# Patient Record
Sex: Female | Born: 1960
Health system: Southern US, Community
[De-identification: ages and names within clinical notes are randomized; demographics above are authoritative.]

## PROBLEM LIST (undated history)

## (undated) ENCOUNTER — Emergency Department (HOSPITAL_COMMUNITY): Admission: EM | Payer: Self-pay | Source: Home / Self Care

## (undated) DIAGNOSIS — R739 Hyperglycemia, unspecified: Secondary | ICD-10-CM

## (undated) DIAGNOSIS — M549 Dorsalgia, unspecified: Secondary | ICD-10-CM

## (undated) DIAGNOSIS — R7303 Prediabetes: Secondary | ICD-10-CM

## (undated) DIAGNOSIS — E119 Type 2 diabetes mellitus without complications: Secondary | ICD-10-CM

## (undated) DIAGNOSIS — G4730 Sleep apnea, unspecified: Secondary | ICD-10-CM

## (undated) DIAGNOSIS — R12 Heartburn: Secondary | ICD-10-CM

## (undated) DIAGNOSIS — J45909 Unspecified asthma, uncomplicated: Secondary | ICD-10-CM

## (undated) DIAGNOSIS — R1310 Dysphagia, unspecified: Secondary | ICD-10-CM

## (undated) DIAGNOSIS — R0602 Shortness of breath: Secondary | ICD-10-CM

## (undated) DIAGNOSIS — E559 Vitamin D deficiency, unspecified: Secondary | ICD-10-CM

## (undated) DIAGNOSIS — R32 Unspecified urinary incontinence: Secondary | ICD-10-CM

## (undated) DIAGNOSIS — F32A Depression, unspecified: Secondary | ICD-10-CM

## (undated) DIAGNOSIS — E7800 Pure hypercholesterolemia, unspecified: Secondary | ICD-10-CM

## (undated) HISTORY — DX: Shortness of breath: R06.02

## (undated) HISTORY — DX: Vitamin D deficiency, unspecified: E55.9

## (undated) HISTORY — DX: Unspecified asthma, uncomplicated: J45.909

## (undated) HISTORY — DX: Prediabetes: R73.03

## (undated) HISTORY — DX: Pure hypercholesterolemia, unspecified: E78.00

## (undated) HISTORY — DX: Depression, unspecified: F32.A

## (undated) HISTORY — DX: Unspecified urinary incontinence: R32

## (undated) HISTORY — DX: Hyperglycemia, unspecified: R73.9

## (undated) HISTORY — DX: Type 2 diabetes mellitus without complications: E11.9

## (undated) HISTORY — DX: Dorsalgia, unspecified: M54.9

## (undated) HISTORY — DX: Sleep apnea, unspecified: G47.30

## (undated) HISTORY — DX: Dysphagia, unspecified: R13.10

## (undated) HISTORY — PX: FOOT SURGERY: SHX648

## (undated) HISTORY — DX: Heartburn: R12

## (undated) HISTORY — PX: CHOLECYSTECTOMY: SHX55

## (undated) HISTORY — PX: KNEE SURGERY: SHX244

---

## 2006-02-27 ENCOUNTER — Other Ambulatory Visit: Admission: RE | Admit: 2006-02-27 | Discharge: 2006-02-27 | Payer: Self-pay | Admitting: Family Medicine

## 2006-07-04 ENCOUNTER — Encounter: Admission: RE | Admit: 2006-07-04 | Discharge: 2006-07-04 | Payer: Self-pay | Admitting: Family Medicine

## 2008-12-26 ENCOUNTER — Other Ambulatory Visit: Admission: RE | Admit: 2008-12-26 | Discharge: 2008-12-26 | Payer: Self-pay | Admitting: Family Medicine

## 2009-03-11 LAB — CONVERTED CEMR LAB: Pap Smear: NORMAL

## 2009-11-10 ENCOUNTER — Encounter: Payer: Self-pay | Admitting: Family Medicine

## 2010-01-23 ENCOUNTER — Encounter: Payer: Self-pay | Admitting: Family Medicine

## 2010-01-23 ENCOUNTER — Ambulatory Visit: Payer: Self-pay | Admitting: Family Medicine

## 2010-01-23 DIAGNOSIS — F339 Major depressive disorder, recurrent, unspecified: Secondary | ICD-10-CM

## 2010-01-23 DIAGNOSIS — E669 Obesity, unspecified: Secondary | ICD-10-CM | POA: Insufficient documentation

## 2010-01-24 ENCOUNTER — Telehealth: Payer: Self-pay | Admitting: Family Medicine

## 2010-01-24 ENCOUNTER — Telehealth: Payer: Self-pay | Admitting: Psychology

## 2010-02-01 ENCOUNTER — Ambulatory Visit: Payer: Self-pay | Admitting: Psychology

## 2010-02-14 ENCOUNTER — Telehealth: Payer: Self-pay | Admitting: Psychology

## 2010-02-19 ENCOUNTER — Ambulatory Visit: Payer: Self-pay | Admitting: Family Medicine

## 2010-02-19 DIAGNOSIS — B070 Plantar wart: Secondary | ICD-10-CM

## 2010-02-19 DIAGNOSIS — M25519 Pain in unspecified shoulder: Secondary | ICD-10-CM

## 2010-02-22 ENCOUNTER — Ambulatory Visit: Payer: Self-pay | Admitting: Psychology

## 2010-03-02 ENCOUNTER — Telehealth: Payer: Self-pay | Admitting: *Deleted

## 2010-03-05 ENCOUNTER — Encounter: Payer: Self-pay | Admitting: Family Medicine

## 2010-03-05 ENCOUNTER — Ambulatory Visit: Payer: Self-pay | Admitting: Family Medicine

## 2010-03-05 DIAGNOSIS — M549 Dorsalgia, unspecified: Secondary | ICD-10-CM | POA: Insufficient documentation

## 2010-03-05 DIAGNOSIS — R030 Elevated blood-pressure reading, without diagnosis of hypertension: Secondary | ICD-10-CM | POA: Insufficient documentation

## 2010-03-05 LAB — CONVERTED CEMR LAB
BUN: 13 mg/dL (ref 6–23)
CO2: 24 meq/L (ref 19–32)
Calcium: 9.6 mg/dL (ref 8.4–10.5)
Creatinine, Ser: 0.77 mg/dL (ref 0.40–1.20)
HDL: 45 mg/dL (ref >39)
Vit D, 25-Hydroxy: 38 ng/mL (ref 30–89)

## 2010-03-07 ENCOUNTER — Ambulatory Visit: Payer: Self-pay | Admitting: Family Medicine

## 2010-04-20 ENCOUNTER — Encounter: Payer: Self-pay | Admitting: Psychology

## 2010-04-20 ENCOUNTER — Encounter: Payer: Self-pay | Admitting: *Deleted

## 2010-04-20 ENCOUNTER — Ambulatory Visit: Payer: Self-pay | Admitting: Family Medicine

## 2010-04-25 ENCOUNTER — Encounter
Admission: RE | Admit: 2010-04-25 | Discharge: 2010-06-15 | Payer: Self-pay | Source: Home / Self Care | Admitting: Family Medicine

## 2010-04-27 ENCOUNTER — Telehealth: Payer: Self-pay | Admitting: *Deleted

## 2010-04-30 ENCOUNTER — Ambulatory Visit: Payer: Self-pay | Admitting: Psychology

## 2010-05-21 ENCOUNTER — Ambulatory Visit: Payer: Self-pay | Admitting: Family Medicine

## 2010-05-21 ENCOUNTER — Ambulatory Visit: Payer: Self-pay | Admitting: Psychology

## 2010-05-29 ENCOUNTER — Ambulatory Visit: Payer: Self-pay | Admitting: Family Medicine

## 2010-06-07 ENCOUNTER — Ambulatory Visit: Payer: Self-pay | Admitting: Family Medicine

## 2010-06-07 ENCOUNTER — Encounter: Payer: Self-pay | Admitting: Family Medicine

## 2010-06-13 ENCOUNTER — Encounter: Payer: Self-pay | Admitting: Family Medicine

## 2010-06-13 ENCOUNTER — Ambulatory Visit: Payer: Self-pay | Admitting: Family Medicine

## 2010-06-25 ENCOUNTER — Telehealth: Payer: Self-pay | Admitting: Psychology

## 2010-07-23 ENCOUNTER — Ambulatory Visit (HOSPITAL_COMMUNITY)
Admission: RE | Admit: 2010-07-23 | Discharge: 2010-07-23 | Payer: Self-pay | Source: Home / Self Care | Attending: Gastroenterology | Admitting: Gastroenterology

## 2010-08-23 ENCOUNTER — Ambulatory Visit: Admit: 2010-08-23 | Payer: Self-pay

## 2010-08-24 ENCOUNTER — Ambulatory Visit: Admit: 2010-08-24 | Payer: Self-pay

## 2010-08-31 ENCOUNTER — Ambulatory Visit: Admit: 2010-08-31 | Payer: Self-pay

## 2010-09-11 NOTE — Assessment & Plan Note (Signed)
Summary: Initial behavioral Medicine   Primary Care Provider:  Ellin Mayhew MD   History of Present Illness: Brooke Clarke presented for an initial psychological assessment.  A client information sheet detailing the Behavioral Medicine Service was provided and contained information on confidentiality and the limits thereof.  Presenting problem: History of depression.  Dr. Edmonia James thought she should be seen.  She acknowledges the lack of social support is hard and she "needs someone to talk to."  Her biggest issue is her weight.  She reports gaining about 100 pounds in 10 years and attributes it to "sitting on her butt" at work.  She lacks motivation to exercise but does sound like she goes to the gym on occasion.  She walks her two dogs and considers this a chore.  Medical history: No major medical issues noted with the exception of obesity.  She has been on Paxil for a long time and has tried to discontinue it because she thinks it is another reason she gained weight / can't lose it.  She is down to 10 mg and tried cutting down to 5 mg but could not manage the symptoms she had (crying uncontrollably for instance).  Psychiatric / psychological history: Individual therapy about 15 years ago.  Denied hospitalization for psych reasons.  Has been tried on Prozac and Wellbutrin.  She said she could not tolerate the latter - it made her very anxious.  Psychosocial history: Married twice - once when 50 years old.  Lasted a year.  No kids.  Does not have contact. Second marriage to an [place].  Two kids:  Brooke Clarke (20) and Brooke Clarke (18).  Divorced around 16-Jan-2001.  They were living in [place].  He had an affair.  He is now married to that woman.  They have a 85 year old son together.  Brooke Clarke moved to [place] (she is from here), bought a house and started working.  Ex-husband is a Comptroller for [employer] in [place].  He helps her financially.  Daughter Brooke Clarke became pregnant recently (baby due August 1st).  She  married her boyfriend (and FOB) of two years recently.  They are living with his parents in [place] and plan to continue their education while raising their child together.  Brooke Clarke is with her Dad in [place] right now.  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  Per Ileana's report, Brooke Clarke has IBS.  She was cutting foods out of her diet in order to manager her IBS and dropped to 77 pounds.  She was admitted to [facility] and eventually transferred to the [facility] for re-feeding.  Brooke Clarke denies that Brooke Clarke has an eating disorder reporting that the way Brooke Clarke was trying to manage her IBS made her weight drop so low that she was no longer able to hold on to anything she ate and therefore, continued to lose.  This was a several month period of time in late Jan 16, 2009.  As a result of being away from work so much, Brooke Clarke lost her job at [employer]  [name]'s parents were married.  Her dad died in 17-Jan-1999 due to complications from DM.  Her mom is alive at 77 and is in poor health.  Brooke Clarke helps to care for her.    History of abuse: Did not assess.  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  She was not particularly happy at [employer]  and is not too upset about losing her job.  She is drawing unemployment.  She plans to start looking for jobs after her granddaughter is born.    Substance use: No tobacco.  Occasional alcohol (once a month maybe - one glass of wine).  Denied other use of drugs.  Other: Typical day currently:  Gets up at 8:00 a.m.  Checks email and submits applications (per her unemployment terms).  Wakls dog, eats cereal, drinks coffee.  Tries to convince herself to exercise.  Naps (occasionally) from 2-4 pm.  Does eat lunch and dinner.  Goes to bed around 10:00 or 11:00.  No difficulty sleeping.  She finished a huge project of getting her house organized recently.  She does not have  another project currently.  Attends church services but is not well connected.  Desires social support.     Allergies: No Known Drug Allergies   Impression & Recommendations:  Problem # 1:  DEPRESSIVE DISORDER NOT ELSEWHERE CLASSIFIED (ICD-311)  [place] is neatly groomed and appropriately dressed.  She maintains adequate eye contact but not great.  She is moderately cooperative and attentive.  Speech is normal in tone, rate and rhythm.  Mood is depressed with an irritable and tearful affect.  Thought process is logical and goal directed.  Does not appear to be responding to any internal stimuli.  Judgment and insight are average.  She wants to come off the Paxil.  At 10 mg it is not likely helping her all that much (her maintenance dose was at least 20 mg).  Recommended a Mood Disorder Clinic appointment to discuss options.  Her PHQ-9 score was 19 indicating symptoms of depression that were at least moderate.  Of note - she indicated thoughts about being better off dead or hurting herself in some way "more than half the days."  During the clinical interview, she reported some passive suicidal ideation and reported that she could never do anything to hurt herself because she would "go straight to hell."  She was concerned about confidentiality around this issue (afraid of involuntary commitment ??).  We reviewed the specifics of confidentiality again and she seemed relieved.  Report of first mood disturbance was as a teenager.  Did not elicit family history of mood issues but need to do that.  I suspect that she has dysthymia or major depression and may need medication to help her manage these symptoms.  Exercise would help as a mood elevator but her mood symptoms are a major barrier to this right now.  Will meet again on:  July 14th at 3:00 to explore further.  Will also appreciate the help of Mood Disorder Clinic.  That appt is for:  July 27th at 9:00.  Will touch base with Dr. Edmonia James to  clarify some things about Jameria's history.  Also - if we don't have a recent TSH and blood count on her, it might be useful.    Her updated medication list for this problem includes:    Paxil 20 Mg Tabs (Paroxetine hcl)  [facility]  Complete Medication List: 1)  Paxil 20 Mg Tabs (Paroxetine hcl) 2)  Multivitamin  3)  Aloe Supplement  4)  Tyrosine Powder Supplement

## 2010-09-11 NOTE — Miscellaneous (Signed)
Summary: Orders Update  Clinical Lists Changes  Orders: Added new Test order of [facility]- Est Level  3 (99213) - Signed  

## 2010-09-11 NOTE — Assessment & Plan Note (Signed)
Summary: obesity/wart/depression   Vital Signs:  Patient profile:   50 year old female Weight:      221 pounds BMI:     39.55 Temp:     97.9 degrees F oral Pulse rate:   69 / minute BP sitting:   133 / 84  (left arm)  Vitals Entered By: Tessie Fass CMA (April 20, 2010 10:09 AM) CC: F/U Is Patient Diabetic? No   Primary Care Provider:  Ellin Mayhew MD  CC:  F/U.  History of Present Illness: Obesity: Pt is working out on eliptical 30 min 4-5 x week.  She is working on eating a nutritiuous diet.  Pt has had some weight loss since last appt-- 7 lbs.  Pt has not met with nutritionist here at clinic.  States that she is doing well with nutrition changes on her own.  callous on 2nd toe of left foot: callous/wart continues to be present- was frozen at previous appt.  Pt requesting that cryotherapy be tried again.  Had explained to pt at last visit that it may make multiple treatments and sometimes these areas never go away.  Depression: Pt states that she is feeling better, more motivated,  is working on the medication changes as outlined by providers in mood disorder clinic.  Is seeing Dr. Pascal Lux for therapy.  States that she feels that this is slowly helping her improve.    Current Medications (verified): 1)  Paxil 10 Mg Tabs (Paroxetine Hcl) .... Per Dr. Kathrynn Running in Mood Disorder Clinic, Take One Daily. 2)  Multivitamin 3)  Aloe Supplement 4)  Tyrosine Powder Supplement 5)  Ibuprofen 600 Mg Tabs (Ibuprofen) .... Take 1 Tablet Every 8 Hours As Needed For Pain 6)  Prozac 10 Mg Caps (Fluoxetine Hcl) .... Per Dr. Kathrynn Running in Mood Disorder Clinic.  Take One Pill Per Day.  Allergies (verified): No Known Drug Allergies  Review of Systems       as per hpi  Physical Exam  General:  VSS Well-developed,well-nourished,in no acute distress; alert,appropriate and cooperative throughout examination Lungs:  Normal respiratory effort, chest expands symmetrically. Lungs are clear to  auscultation, no crackles or wheezes. Heart:  Normal rate and regular rhythm. S1 and S2 normal without gallop, murmur, click, rub or other extra sounds. Pulses:  2+ Extremities:  no edema small plantar wart on 2nd digit of right foot on lateral aspect along nail. Psych:  Cognition and judgment appear intact. Alert and cooperative with normal attention span and concentration. No apparent delusions, illusions, hallucinations   Impression & Recommendations:  Problem # 1:  OBESITY, UNSPECIFIED (ICD-278.00) Encouraged pt to keep up the good work!  Pt will return in 1 month for RN weight check and with me in 2 months for follow up for weight and for further review of PCMH form.   Orders: [facility]- Est  Level 4 (16109)  Problem # 2:  PLANTAR WART (ICD-078.12) Applied cryotherapy to area x 3 using gun applicator.  Pt tolerated well.  Pt aware that the area may not resolve even with this repeat treatment. Pt to return as needed for further treatment. Orders: [facility]- Est  Level 4 (99214)  Problem # 3:  DEPRESSIVE DISORDER NOT ELSEWHERE CLASSIFIED (ICD-311) Pt to continue to meet with mood disorder clinic and with Dr. Pascal Lux for therapy.  Pt feels she is moving in the right direction in this area.  Encouraged pt to keep up the hard work.  Her updated medication list for this problem includes:  Paxil 10 Mg Tabs (Paroxetine hcl) .Marland Kitchen... Per dr. Kathrynn Running in mood disorder clinic, take one daily.    Prozac 10 Mg Caps (Fluoxetine hcl) .Marland Kitchen... Per dr. Kathrynn Running in mood disorder clinic.  take one pill per day.  Orders: [facility]- Est  Level 4 (21308)  Problem # 4:  Preventive Health Care (ICD-V70.0) Reviewed PCMH form with pt.  Pt is due for colonoscopy and mammogram.  Pt states that she wants to start first with Colonoscopy and then will consider mammogram.   Pt had previous order placed for PT for eval of right shoulder rotator cuff pain.  Pt states that this was never sheduled.  She would like to meet with them a few  times to learn the needed exercises and then continue the exercises on her own.  Will ensure that these referrals are in place.    Complete Medication List: 1)  Paxil 10 Mg Tabs (Paroxetine hcl) .... Per dr. Kathrynn Running in mood disorder clinic, take one daily. 2)  Multivitamin  3)  Aloe Supplement  4)  Tyrosine Powder Supplement  5)  Ibuprofen 600 Mg Tabs (Ibuprofen) .... Take 1 tablet every 8 hours as needed for pain 6)  Prozac 10 Mg Caps (Fluoxetine hcl) .... Per dr. Kathrynn Running in mood disorder clinic.  take one pill per day.  Patient Instructions: 1)  return in 1 month for weight check- make nurse visit for this 2)  Return to see me in 2 months for follow up on weight loss. 3)  Check to see when last tetanus was.   4)  Also, continue to follow up as scheduled with dr. Pascal Lux and dr. Kathrynn Running.  5)  We will call you with an appt for Physical therapy and for your appt for a colonoscopy 6)  You are also due for an appt for a mammogram.  Let me know when you are ready to set up this appt.    Prevention & Chronic Care Immunizations   Influenza vaccine: Not documented   Influenza vaccine deferral: Deferred  (04/20/2010)    Tetanus booster: Not documented    Pneumococcal vaccine: Not documented  Colorectal Screening   Hemoccult: Not documented   Hemoccult action/deferral: Not indicated  (03/05/2010)    Colonoscopy: Not documented   Colonoscopy action/deferral: GI Referral  (03/05/2010)  Other Screening   Pap smear: per pt normal- no hx of abnormal  (03/11/2009)   Pap smear action/deferral: Deferred-3 yr interval  (03/05/2010)   Pap smear due: 03/11/2012    Mammogram: per pt. -normal  (03/11/2009)   Mammogram action/deferral: Ordered  (03/05/2010)   Mammogram due: 03/10/2010   Smoking status: never  (01/23/2010)  Lipids   Total Cholesterol: 249  (03/05/2010)   LDL: 169  (03/05/2010)   LDL Direct: Not documented   HDL: 45  (03/05/2010)   Triglycerides: 175  (03/05/2010)

## 2010-09-11 NOTE — Assessment & Plan Note (Signed)
Summary: depression/obesity/right shoulder/wart   Vital Signs:  Patient profile:   50 year old female Weight:      218 pounds BMI:     39.01 Temp:     98.5 degrees F oral Pulse rate:   74 / minute BP sitting:   119 / 82  (right arm) Cuff size:   large  Vitals Entered By: Tessie Fass CMA (June 13, 2010 2:05 PM) CC: F/U Is Patient Diabetic? No   Primary Care Provider:  Ellin Mayhew MD  CC:  F/U.  History of Present Illness: depression: Pt has not liked side effects of antidepressants in the past.  (see detailed discussion in Dr. Carola Rhine previous visits)   She states that 2/2 her depression she lacks motivation to work out and this contributes to her weight gain yet she feels like some antidepressents in the past have also contributed to her weight gain.   Has never taken zoloft so has agreed to try this.  right shoulder pain: not improved with PT.  Has been doing exercises and seeing pt x 2-3 weeks.   4th toe of right foot wart: requests that i freeze the wart again.  This will be treatment #3.    obesity: congratulated pt on her almost 15 lb weight loss!  although she states that she hasn't worked out x 1 week and that her diet is worsened , I encourage her to regain her motivation and continue the downward weight trend.    Habits & Providers  Alcohol-Tobacco-Diet     Tobacco Status: never  Current Medications (verified): 1)  Multivitamin 2)  Aloe Supplement 3)  Tyrosine Powder Supplement 4)  Ibuprofen 600 Mg Tabs (Ibuprofen) .... Take 1 Tablet Every 8 Hours As Needed For Pain 5)  Zoloft 25 Mg Tabs (Sertraline Hcl) .... Take 1 Tablet Daily  Allergies (verified): No Known Drug Allergies  Review of Systems       no fever. no rashes. no chnages in bowel or bladder.   no SI or HI.    Physical Exam  General:  VSS Well-developed,well-nourished,in no acute distress; alert,appropriate and cooperative throughout examination Lungs:  Normal respiratory effort,  chest expands symmetrically. Lungs are clear to auscultation, no crackles or wheezes. Heart:  Normal rate and regular rhythm. S1 and S2 normal without gallop, murmur, click, rub or other extra sounds. Msk:  right shoulder exam: normal ROM with some pain in right anterior shoulder with right arm straight and posterior to torso.  No pain with empty cup exam.  Some minimal discomfort with movement of arm to touch central back area.   Extremities:  wart present at medial side of nail of 2nd toe of right foot.  Psych:  Cognition and judgment appear intact. Alert and cooperative with normal attention span and concentration. No apparent delusions, illusions, hallucinations   Impression & Recommendations:  Problem # 1:  DEPRESSIVE DISORDER NOT ELSEWHERE CLASSIFIED (ICD-311) Pt started on zoloft.  Pt has follow up appt with Dr. Pascal Lux and also a follow up appt with me in 3-4 weeks for titration of medication.  The following medications were removed from the medication list:    Prozac 10 Mg Caps (Fluoxetine hcl) .Marland Kitchen... Per dr. Kathrynn Running in mood disorder clinic.  take one pill per day. Her updated medication list for this problem includes:    Zoloft 25 Mg Tabs (Sertraline hcl) .Marland Kitchen... Take 1 tablet daily  Orders: [facility]- Est  Level 4 (29562)  Problem # 2:  PLANTAR WART (ZHY-865.78)  3rd treatment of cryotherapy applied ot wart.  Pt is aware that some warts are very difficult to remove and that this may not be curative.  Orders: [facility]- Est  Level 4 (99214)  Problem # 3:  SHOULDER PAIN, RIGHT, CHRONIC (ICD-719.41) Pt is to continue with PT exercises.  I feel that this is most likely  rotator cuff inflammation.  If not improved by next appt will consider referral to sports' medicine clinic.   The following medications were removed from the medication list:    Percocet 5-325 Mg Tabs (Oxycodone-acetaminophen) .Marland Kitchen... 1 to 2 tabs by mouth every 6 hours as needed for extreme pain Her updated medication list for this  problem includes:    Ibuprofen 600 Mg Tabs (Ibuprofen) .Marland Kitchen... Take 1 tablet every 8 hours as needed for pain  Orders: [facility]- Est  Level 4 (16109)  Problem # 4:  OBESITY, UNSPECIFIED (ICD-278.00) pt encouraged to continue to work on diet and exercise.  Orders: [facility]- Est  Level 4 (60454)  Problem # 5:  Preventive Health Care (ICD-V70.0) at future visit need to review PCMH form with pt.   Complete Medication List: 1)  Multivitamin  2)  Aloe Supplement  3)  Tyrosine Powder Supplement  4)  Ibuprofen 600 Mg Tabs (Ibuprofen) .... Take 1 tablet every 8 hours as needed for pain 5)  Zoloft 25 Mg Tabs (Sertraline hcl) .... Take 1 tablet daily  Patient Instructions: 1)  take zoloft as directed- return in 3-4 weeks for reevaluation 2)  Continue to work hard on weight loss- remember to plan the day before to exercise the next day. 3)  If continued right shoulder pain at next appt--we can discuss possible referral to sport's medicine clinic.  Prescriptions: ZOLOFT 25 MG TABS (SERTRALINE HCL) take 1 tablet daily  #30 x 3   Entered and Authorized by:   Ellin Mayhew MD   Signed by:   Ellin Mayhew MD on 06/13/2010   Method used:   Print then Give to Patient   RxID:   [id number]    Orders Added: 1)  [facility]- Est  Level 4 [30865]

## 2010-09-11 NOTE — Assessment & Plan Note (Signed)
Summary: Behavioral Medicine Follow-up   Primary Care Provider:  Ellin Mayhew MD   History of Present Illness: Brooke Clarke reports that she is doing well overall despite some stress the last week or so.  Once she figured out where it was coming from, her mood improved.  She was recently disappointed to discover that she had not lost 30 pounds in six weeks as she had thought.  Rather, she lost 10.  The scale at the [facility] was off.  She is having trouble looking favorable at her weight loss given the fact that it is much less than what she originally thought.  We discussed this.  She has been off the Paxil for one week.  Her last dose was 5 mg - breaking it to 2.5 mg proved too difficult.  She has not had any unpleasant effects.  She is wanting to come off the Prozac.  She remembers it causing shortness of breath while exercising the last time she took it and is noticing that again.  She is taking 10 mg.  She is looking for work but has decided that she will wait for a good match both in terms of interest and compensation.  She thinks she has 29 weeks of unemployment left.  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  She likes the structure of it very much.  She is requesting information about a therapist for her daughter.    Allergies: No Known Drug Allergies   Impression & Recommendations:  Problem # 1:  DEPRESSIVE DISORDER NOT ELSEWHERE CLASSIFIED (ICD-311)  Report of mood is good.  Affect is consistent.  Thoughts are clear and goal directed.  Weight:  Discussed exercise and healthy eating as a positive thing to do for her mental and physical health regardless of what the number on the scale shows.  Medication:  Will check with Dr. Kathrynn Running about next best step and will call Brooke Clarke with the information.  Referral for daughter:  Recommended Brooke Clarke who has experience with eating disorders.  Brooke Clarke was given as a back-up in case Brooke Clarke doesn't take Medicaid.  Will follow October 10th at 9:00 or as needed.  The following medications were removed from the medication list:    Paxil 10 Mg Tabs (Paroxetine hcl) .Marland Kitchen... Per dr. Kathrynn Running in mood disorder clinic, take one daily. Her updated medication list for this problem includes:    Prozac 10 Mg Caps (Fluoxetine hcl) .Marland Kitchen... Per dr. Kathrynn Running in mood disorder clinic.  take one pill per day.  Orders: Therapy 40-50- min- [facility] (44010)  Complete Medication List: 1)  Multivitamin  2)  Aloe Supplement  3)  Tyrosine Powder Supplement  4)  Ibuprofen 600 Mg Tabs (Ibuprofen) .... Take 1 tablet every 8 hours as needed for pain 5)  Prozac 10 Mg Caps (Fluoxetine hcl) .... Per dr. Kathrynn Running in mood disorder clinic.  take one pill per day.  Appended Document: Behavioral Medicine Follow-up Discussed situation with Dr. Kathrynn Running who recommended she stop the Prozac.  With Prozac's long half-life, he says there is little risk for discontinuation syndrome.  I called patient to let her know and left her a VM to return my call.

## 2010-09-11 NOTE — Assessment & Plan Note (Signed)
Summary: plantar wart, right shoulder pain   Vital Signs:  Patient profile:   50 year old female Weight:      230 pounds Temp:     98 degrees F Pulse rate:   71 / minute Pulse rhythm:   regular BP sitting:   132 / 82  (left arm) Cuff size:   large  Vitals Entered By: Loralee Pacas CMA (February 19, 2010 1:46 PM) Comments pt states that she was following up with right shoulder and plantar wart on right foot   Primary Care Provider:  Ellin Mayhew MD   History of Present Illness: 1) plantar wart On 2nd toe of right foot along medial nail bed,  has been present for years, frozen x 3 and still present.  Causes a lot of pain with walking against shoe.   2)right shoulder -was told it was bursitis at [facility]- was given a shot and it helped.  was told next step was PT.  600mg  of advil at night to relieve pain.  Feels like there is normal function but that it simply hurts with use.  when she sleeps on right arm this increases pain.  Tries to reposition sleeping position.     3)depression States that therapy session went "ok", scheduled with mood disorder clinic later this month to help make a decision about best medication for pt's treatment of depression. No SI or HI.   4) obesity Pt states she has an appt with nutritionist to help her work on her weight loss goals  5)Preventative health: Pt records have not arrived at office.  Will go ahead and obtain a A1C and lipid panel prior to next appt since she has not had any screening bloodwork.     Current Medications (verified): 1)  Paxil 20 Mg Tabs (Paroxetine Hcl) 2)  Multivitamin 3)  Aloe Supplement 4)  Tyrosine Powder Supplement 5)  Ibuprofen 600 Mg Tabs (Ibuprofen) .... Take 1 Tablet Every 8 Hours As Needed For Pain  Allergies (verified): No Known Drug Allergies  Review of Systems       as per HPI  Physical Exam  General:  VSS obese ,well-nourished,in no acute distress; alert,appropriate and cooperative throughout  examination Lungs:  normal respiratory effort, no intercostal retractions, no accessory muscle use, and normal breath sounds.   Heart:  normal rate.   Msk:  dry, raised nodule present on 2nd toe of right foot along medial nail bed.   Silver dollar size fatty like nodule on arch of left foot.    Scar present on arch of right foot (where fatty nodule removed in past)  right shoulder ROM intact.    Minimal pain with empty can test and reaching of had to middle back--able to perform all movement. No point tenderness of right shoulder joint.  Pulses:  2+ Extremities:  No edema   Impression & Recommendations:  Problem # 1:  PLANTAR WART (ICD-078.12) area frozen.  Pt aware that this treatment may not cause the wart to go away either.  Pt states understanding.  Pt also has lipoma like area on the botton of her left foot.  Had similiar area on right foot and was removed by surgeron 2/2 pain.  Will refer back to surgeon for further eval.  Pt to call with name of provider that did the procedure on her right foot.   Orders: [facility]- Est  Level 4 (16109)  Problem # 2:  SHOULDER PAIN, RIGHT, CHRONIC (ICD-719.41) Most likely this is pain in the  rotator cuff of the right arm.  Minimal pain therefore pt still has full function.  Pt received joint injection in right shoulder for bursitis a few months back.  Pt open to PT will place consult.    Her updated medication list for this problem includes:    Ibuprofen 600 Mg Tabs (Ibuprofen) .Marland Kitchen... Take 1 tablet every 8 hours as needed for pain  Orders: [facility]- Est  Level 4 (96045) Physical Therapy Referral (PT)  Problem # 3:  DEPRESSIVE DISORDER NOT ELSEWHERE CLASSIFIED (ICD-311) Pt is scheduled with mood disorder clinic.  Will look for their input regarding the best agent to start this pt on to meet her weight management and mental health goals.   Her updated medication list for this problem includes:    Paxil 20 Mg Tabs (Paroxetine hcl)  Orders: [facility]- Est   Level 4 (40981)  Problem # 4:  OBESITY, UNSPECIFIED (ICD-278.00) Pt is scheduled with a nutritionist here at the clinic--will follow up with pt after this visit.  Orders: [facility]- Est  Level 4 (99214)Future Orders: Lipid-[facility] (19147-82956) ... 03/14/2011 Vit D, 25 [place]-[facility] ([id number]) ... 02/20/2011 A1C-[facility] (69629) ... 02/20/2011  Problem # 5:  Preventive Health Care (ICD-V70.0) Order placed for pt to get A1C and lipid panel prior to next appt.  Will review Prevention worksheet at next appt and ensure that pt is uptodate on all required screenings.    Complete Medication List: 1)  Paxil 20 Mg Tabs (Paroxetine hcl) 2)  Multivitamin  3)  Aloe Supplement  4)  Tyrosine Powder Supplement  5)  Ibuprofen 600 Mg Tabs (Ibuprofen) .... Take 1 tablet every 8 hours as needed for pain  Patient Instructions: 1)  Go to appt with nutritionist and with Dr. Marlow Baars will discuss their recommendations at the next visit. 2)  Come in a week before your next visit for blood draw to check lipids and glucose. 3)  Make an follow up appointment in 1 month. Prescriptions: IBUPROFEN 600 MG TABS (IBUPROFEN) take 1 tablet every 8 hours as needed for pain  #60 x 1   Entered and Authorized by:   Ellin Mayhew MD   Signed by:   Ellin Mayhew MD on 02/19/2010   Method used:   Electronically to        [facility]  [phone]* (retail)       [address] [place], [place]  [address]       Ph: [phone] or [phone]       Fax: [phone]   RxID:   [id number]    Prevention & Chronic Care Immunizations   Influenza vaccine: Not documented    Tetanus booster: Not documented    Pneumococcal vaccine: Not documented  Colorectal Screening   Hemoccult: Not documented    Colonoscopy: Not documented  Other Screening   Pap smear: Not documented    Mammogram: Not documented   Smoking status: never  (01/23/2010)  Lipids   Total Cholesterol: Not documented   LDL: Not documented   LDL  Direct: Not documented   HDL: Not documented   Triglycerides: Not documented

## 2010-09-11 NOTE — Miscellaneous (Signed)
Summary: Brief meeting  Brooke Clarke was in the waiting room for an appt with Dr. Edmonia James and asked to speak with me.  She was confused about the instructions for coming off the Paxil.  I printed them off for her and reviewed them.  She has been taking Prozac and has decreased her Paxil to 5 mg and has been taking this for two weeks.  She is due to stop it or cut the 5 mg pill in half depending on how she feels.  She was supposed to be seen in [facility] when she initiated prozac to check for periods of increased energy.  She denies any problem although she reports a 30 pound weight loss in 6 weeks with frequent trips to the gym.  We scheduled a beh-med appt for 04/30/10 at 10:00 and I will review then and consult with Dr. Kathrynn Running as needed.

## 2010-09-11 NOTE — Miscellaneous (Signed)
Summary: re: PT and screening colonoscopy/ts  Clinical Lists Changes called pt lmom to return call. mailed information to pt for her to schedule screening colonoscopy. refaxed order to physical therapy, they will call her with appt.Tessie Fass CMA  April 20, 2010 1:29 PM

## 2010-09-11 NOTE — Consult Note (Signed)
Summary: [facility]   Imported By: Knox Royalty 03/03/2010 12:48:32  _____________________________________________________________________  External Attachment:    Type:   Image     Comment:   External Document

## 2010-09-11 NOTE — Progress Notes (Signed)
Summary: Schedule beh med  Phone Note Call from Patient   Caller: Patient Call For: Spero Geralds, Psy.D. Summary of Call: Patient called to schedule a therapy appt.  She left a VM and I returned the calling, leaving a VM. Initial call taken by: Spero Geralds PsyD,  January 24, 2010 4:38 PM  Follow-up for Phone Call        Connected by phone. Scheduled initial beh med for June 23rd at 3:30. Follow-up by: Spero Geralds PsyD,  January 29, 2010 12:27 PM

## 2010-09-11 NOTE — Letter (Signed)
Summary: [facility]  [facility]   Imported By: Knox Royalty 07/17/2010 12:15:01  _____________________________________________________________________  External Attachment:    Type:   Image     Comment:   External Document

## 2010-09-11 NOTE — Assessment & Plan Note (Signed)
Summary: Behavioral Medicine follow-up   Primary Care Provider:  Ellin Mayhew MD   History of Present Illness: Brooke Clarke reports she is feeling better than our last visit.  The stress of her daughter getting married and attempting to come off Paxil influenced her mood in a negative way.  We covered several areas including depression, her relationship with Brooke Clarke and the plans for the birth of her granddaughter.  With regards to depression, last PHQ-9 was 23 (I previously reported it at 56 but I didn't add right!!).  She reports feeling better overall.  Discussed boundaries with Brooke Clarke.  She believes she has a very close but healthy relationship with her 52 year old daughter.  She is concerned about her weight and wants her to lose some.  Brooke Clarke recently read an email exchange between her mom and her dad around this issue and was very upset.  Her daughter Brooke Clarke is due August 1st.  Brooke Clarke is to go to [place] when she gets a phone call.  She is a little concerned about how this will go since Brooke Clarke lives in her in-laws home and Tuesday wants to help as much as possible.    Allergies: No Known Drug Allergies   Impression & Recommendations:  Problem # 1:  DEPRESSIVE DISORDER NOT ELSEWHERE CLASSIFIED (ICD-311)  Reviewed PHQ-9.  Initially she rated her symptoms exactly the same.  This did not match her clinical presentation.  With further questioning, she does not a decrease in suicidal thoughts (religion is a major barrier), depressed mood and agitation / restlessness.  This puts her score at 20.  She smiles spontaneously today - regularly.  She is able to function adequately at home.  Major issues seem to be anhedonia and energy. She ties a lot of this to her weight.  The clinical picture is a bit confusing.  I wondered about match with me as a therapist and explored this issue.  She did report that she didn't have a great feeling about our initial meeting but that she attributed that more to how she was  feeling.  Encouraged her to be vocal about getting her needs met.  Issue with her daughter giving birth came up at the end and she became tearful (which she doesn't like).  Her point:  "If you can't change it, why talk about it."  Discussed how she might prepare for it or seek to change her response to a situation.  For instance, she determined that she can take her daughter's mother-in-law in small doses.  Therefore, it might be wise to have a hotel room to go to at the end of the day.  Also discussed talking with Brooke Clarke ahead of time about her (Brooke Clarke's) expectations.  Finally, Brooke Clarke came up with talking to the mother-in-law at the outset about the challenging situation (Brooke Clarke wanted to support Brooke Clarke and Brooke Clarke living at the Brooke Clarke).  This seemed like a useful exercise and yet I am not sure Brooke Clarke would view it that way.  Brooke Clarke is scheduled for [facility] Clinic for the 27th and is hoping to keep that appt.  She will call to schedule beh-med after that since her schedule will be up in the air with her trip to [place].  Her updated medication list for this problem includes:    Paxil 20 Mg Tabs (Paroxetine hcl)  Orders: Therapy 40-50- min- [facility] (95638)  Complete Medication List: 1)  Paxil 20 Mg Tabs (Paroxetine hcl) 2)  Multivitamin  3)  Aloe Supplement  4)  Tyrosine Powder Supplement  5)  Ibuprofen 600 Mg Tabs (Ibuprofen) .... Take 1 tablet every 8 hours as needed for pain

## 2010-09-11 NOTE — Progress Notes (Signed)
Summary: [facility]  Phone Note Call from Patient   Caller: Patient Summary of Call: called to cxl appt for today - will call back to resch Initial call taken by: De Nurse,  June 25, 2010 2:00 PM  Follow-up for Phone Call        Got message.  Will await patient follow-up.

## 2010-09-11 NOTE — Assessment & Plan Note (Signed)
Summary: New patient visit   Vital Signs:  Patient profile:   50 year old female Height:      159.5 cm Weight:      231 pounds BMI:     41.34 Temp:     98.6 degrees F oral Pulse rate:   82 / minute BP sitting:   152 / 84  (left arm) Cuff size:   large  Vitals Entered By: Tessie Fass CMA (January 23, 2010 2:12 PM) CC: new patient Is Patient Diabetic? No   Primary Care Provider:  Ellin Mayhew MD  CC:  new patient.  History of Present Illness: Pt states that she is here to establish care with a PCP and has a list of medical concerns that she would like to discuss: 1) depression/anxiety 2) weight issues 3) planter wart on right foot 4) bursitis 5) nodule on foot 6) bladder control issues  We decided to pick a couple of issues and then work on the rest of the list at her follow up appointment  1) depression/anxiety pt reports a long history of depression and anxiety.  She has been in therapy in the past but it has been years since she has seen a therapist.  She has been on paxil but has been trying to "wean herself off of it" since she is concerned that this paxil may be contributin to her weight problem.  She reports frequent feelings of anxeity. Frequently feels sad and "down".  Some decrease in energy.  No SI or HI.  2)"Weight problem": Pt states that she has been trying to lose weight but nothing that she ever does seems to help.  She states that she trys to eat right but that she isn't perfect.  She also states that she tries to exercise but not consisent.   Feels that her weight problem is making her more depressed.    Habits & Providers  Alcohol-Tobacco-Diet     Tobacco Status: never  Current Medications (verified): 1)  Paxil 20 Mg Tabs (Paroxetine Hcl) 2)  Multivitamin 3)  Aloe Supplement 4)  Tyrosine Powder Supplement  Allergies (verified): No Known Drug Allergies  Past History:  Past Medical History: anxiety/ depression--  Past Surgical History: right  knee surgery- meniscus tear right foot surgery- cyst removed  Family History: Father and paternal grandmother- diabetes Mother- skin cancer( melanoma) Father- CHF  Social History: lives at home with daughter Tawanna Cooler, older daughter 47, newly wed on 01/22/10- expecting first grandchild 8/11.  X-husband lives in [place].  Lizzy goes to visit once per year.  Tawanna Cooler has a severe eating disorder that caused her to be admited on the eating disorder meeting for approx 2 months last winter.  1 glass of wine/ month,  no tobacco, no drugs.   Not in sexual relationship right now. not at risk for std's. Smoking Status:  never  Review of Systems  The patient denies anorexia, fever, chest pain, syncope, dyspnea on exertion, and peripheral edema.    Physical Exam  General:  VSS Well-developed,obese ,in no acute distress; alert,appropriate and cooperative throughout examination Head:  Normocephalic and atraumatic without obvious abnormalities. No apparent alopecia or balding. Lungs:  normal respiratory effort and no accessory muscle use.   Heart:  normal rate.   Msk:  normal gait Pulses:  2+ Extremities:  No edema Skin:  Intact without suspicious lesions or rashes Psych:  Cognition and judgment appear intact. Alert and cooperative with normal attention span and concentration. No apparent delusions, illusions, hallucinations  Impression & Recommendations:  Problem # 1:  DEPRESSIVE DISORDER NOT ELSEWHERE CLASSIFIED (ICD-311) explained to patient the importance of taking SSRI in combination with therapy.  Pt encouraged to continue to take her paxil.  Pt is to call Dr. Pascal Lux to see if she can get an appointment for therapy.  Also would like to have Dr. Carola Rhine input on the best medication to treat this patients depression/anxiety.  Also will ask Dr. Pascal Lux which SSRI is best for patients that are concerned about the side effect of weight gain.  pt is to f/up in 1 month.    Her updated medication list for  this problem includes:    Paxil 20 Mg Tabs (Paroxetine hcl)  Problem # 2:  OBESITY, UNSPECIFIED (ICD-278.00)  Pt already making attempts at exercise plan.  Nutrition referral placed to help pt with meal planning.  will f/up with patient in 1 month.   Orders: Nutrition Referral (Nutrition)  Complete Medication List: 1)  Paxil 20 Mg Tabs (Paroxetine hcl) 2)  Multivitamin  3)  Aloe Supplement  4)  Tyrosine Powder Supplement   Patient Instructions: 1)  Call and make an appointment with Dr. Pascal Lux 2)  Will call with an appointment with nutritionist.   3)  Make an appointment to follow up with me in 4-6 weeks.

## 2010-09-11 NOTE — Assessment & Plan Note (Signed)
Summary: Initial Mood Disorder Clinic   Primary Care Provider:  Ellin Mayhew MD   History of Present Illness: Goal of visit is to discuss coming off of Paxil.  She thinks it is making it difficult to lose weight.  She does not know if she needs medicine for her mood but would like to try without it.  Psychotropic history:  Prozac initially about 10 years ago.  Stayed on it for several years.  Switched to Wellbutrin and had a panic attack on it (first one she had).  Started on Paxil and has been on it ever since.  Family history:  Paternal and maternal grandfathers with alcohol issues.  Reported full brother has an irritable mood.  Maternal uncle with schizophrenia.  Upon further questioning, she reported that when she first started Prozac, she noticed a significant shift in energy nearly immediately.  She couldn't "run enough."  She felt good for about a year.  She denies feeling a crash afterward but remembers a decreased need for sleep.  This was 10 years ago and she has a little trouble recalling the details.  She notes that her daughter is on prozac and is "not herself" in that she is "too happy."  She describes both her experience and her daughter's as not being able to be serious about the things in life that you need to be.    Record reviewed.  See Initial Behavioral Medicine note (02/01/2010) for further background information.  Allergies: No Known Drug Allergies   Impression & Recommendations:  Problem # 1:  DEPRESSIVE DISORDER NOT ELSEWHERE CLASSIFIED (ICD-311)  Administered the CIDI 3.0 - a structured interview for Bipolar Disorder.  She had almost all positive responses.  Discussed risk / benefit.  Originally considered starting Prozac at 20 mg and monitoring closely for shifts in energy.  Given history, Dr. Kathrynn Running recommended starting at 10 mg which should still be able to help the Paxil wean but less likely to cause a hypomanic or manic episode.  Tylia was able to teach back  the plan:  start Prozac 10 mg and wait a week.  Then start decreasing Paxil - slowly.  Would decrease to 5 mg after one week and stay on 5 mg for two weeks and then try to stop it.  She was also able to state red flags including suicidal or homicidal ideation, decreased need for sleep, impulsive behavior - specifically spending money.  She has a busy few weeks with her daughter returning from [place] tomorrow and her other daughter due on August 1st.  Not a good time to make this change.  We all agreed best to wait until after the baby is born.  She will call when she initiates the change.  Her updated medication list for this problem includes:    Paxil 10 Mg Tabs (Paroxetine hcl) .Marland Kitchen... Per dr. Kathrynn Running in mood disorder clinic, take one daily.    Prozac 10 Mg Caps (Fluoxetine hcl) .Marland Kitchen... Per dr. Kathrynn Running in mood disorder clinic.  take one pill per day.  Orders: Therapy 40-50- min- [facility] (06301)  Complete Medication List: 1)  Paxil 10 Mg Tabs (Paroxetine hcl) .... Per dr. Kathrynn Running in mood disorder clinic, take one daily. 2)  Multivitamin  3)  Aloe Supplement  4)  Tyrosine Powder Supplement  5)  Ibuprofen 600 Mg Tabs (Ibuprofen) .... Take 1 tablet every 8 hours as needed for pain 6)  Prozac 10 Mg Caps (Fluoxetine hcl) .... Per dr. Kathrynn Running in mood disorder clinic.  take one pill per day.  Patient Instructions: 1)  Dr. Kathrynn Running recommended you start Prozac 10 mg in order to help you come off the Paxil. 2)  After one week on Prozac, you can decrease the Paxil to 5 mg.  After two weeks on 5 mg you can try to stop it. 3)  If you struggle with this, you can try to cut the 5 mg in half. 4)  Despite starting the Prozac, you may still experience some challenging symptoms.  These should go away shortly and should not be impairing. 5)  We need to see you a week after you start the Prozac.  If you can't see [place] in [facility], please see Dr. Edmonia James or someone on her team.  We are concerned that the  medication might increase your energy in an unhealthy way. 6)  Please call [phone] with any questions or concerns. Prescriptions: PROZAC 10 MG CAPS (FLUOXETINE HCL) Per Dr. Kathrynn Running in [facility].  Take one pill per day.  #30 x 0   Entered and Authorized by:   Spero Geralds PsyD   Signed by:   Spero Geralds PsyD on 03/07/2010   Method used:   Handwritten   RxID:   [id number] PAXIL 10 MG TABS (PAROXETINE HCL) Per Dr. Kathrynn Running in [facility], take one daily.  #30 x 0   Entered and Authorized by:   Spero Geralds PsyD   Signed by:   Spero Geralds PsyD on 03/07/2010   Method used:   Handwritten   RxID:   [id number]

## 2010-09-11 NOTE — Progress Notes (Signed)
Summary: Schedule [facility] appt.  Phone Note Outgoing Call   Call placed by: Spero Geralds PsyD,  February 14, 2010 3:36 PM Call placed to: Patient Summary of Call: Patient not scheduled in [facility] for [facility] appt.  Wanted to make sure she was planning on attending on July 27th at 9:00.  I left a VM and asked that she call me.  She also has a beh-med appt for the 14th at 3:00.   Initial call taken by: Spero Geralds PsyD,  February 14, 2010 3:37 PM  Follow-up for Phone Call        Discussed with patients.  She is planningo n attending both appts.  She was put in [facility] by [place] I think. Follow-up by: Spero Geralds PsyD,  February 16, 2010 11:11 AM

## 2010-09-11 NOTE — Progress Notes (Signed)
Summary: waiting for pt to call back/see message/ts  ---- Converted from flag ---- ---- 04/26/2010 9:40 PM, Ellin Mayhew MD wrote: Pt needs colonoscopy appt and PT appt can you make sure that she is scheduled for both.  These referrals were placed back in July 2011.  Thanks so much for your help! ------------------------------ called pt and lmvm to call back. referral for PT was faxed and also letter was mailed with recommendation of screening colonoscopy and mammogram. see under order.Arlyss Repress CMA,  April 27, 2010 4:49 PM

## 2010-09-11 NOTE — Assessment & Plan Note (Signed)
Summary: flu shot,tcb  Nurse Visit   Vital Signs:  Patient profile:   50 year old female Temp:     98.3 degrees F  Vitals Entered By: Theresia Lo RN (May 21, 2010 8:59 AM)  Allergies: No Known Drug Allergies  Immunizations Administered:  Influenza Vaccine # 1:    Vaccine Type: Fluvax 3+    Site: left deltoid    Mfr: [employer]oSmith[employer]    Dose: 0.5 ml    Route: IM    Given by: Theresia Lo RN    Exp. Date: 02/06/2011    Lot #: [id number]    VIS given: 03/06/10 version given May 21, 2010.  Flu Vaccine Consent Questions:    Do you have a history of severe allergic reactions to this vaccine? no    Any prior history of allergic reactions to egg and/or gelatin? no    Do you have a sensitivity to the preservative Thimersol? no    Do you have a past history of Guillan-Barre Syndrome? no    Do you currently have an acute febrile illness? no    Have you ever had a severe reaction to latex? no    Vaccine information given and explained to patient? yes    Are you currently pregnant? no  Orders Added: 1)  Flu Vaccine 62yrs + [90658] 2)  Admin 1st Vaccine [09811]

## 2010-09-11 NOTE — Assessment & Plan Note (Signed)
Summary: eye red/swollen,df   Vital Signs:  Patient profile:   50 year old female Weight:      217.7 pounds Temp:     97.8 degrees F oral  Vitals Entered By: Arlyss Repress CMA, (May 29, 2010 9:11 AM) CC: right eye swelling and irritation x 1 week Is Patient Diabetic? No Pain Assessment Patient in pain? yes     Location: r eye Intensity: 10  Vision Screening:Left eye w/o correction: 20 / 60 Right Eye w/o correction: 20 / 30        Vision Entered By: Jimmy Footman, CMA (May 29, 2010 9:58 AM)   Primary Care Provider:  Ellin Mayhew MD  CC:  right eye swelling and irritation x 1 week.  History of Present Illness: Pt seen for work-in, complaint of RIGHT eye pain x 10 days.  Was gardening 10 days ago, thinks some dirt entered her eye.  Did not have contact lens in place at the time of the fb entry into eye (while gardening).  Took out her contact lense from RIGHT eye (does not use one in the left), and left contact out for 9 days.  Noticed gradual improvement in that time.  No eye discharge, pain and sensation of foreign body improved.  Replaced contact lens in RIGHT eye yesterday, had worse pain and redness since then.  Has had photophobia in RIGHT eye, with excessive lacrimation.    No prior history of glaucoma or other indications for eye drops.  Had lasik surgery in L eye in 2006.  Presently without insurance, concerned about cost of treatment with ophthalmologist.   Habits & Providers  Alcohol-Tobacco-Diet     Tobacco Status: never  Allergies: No Known Drug Allergies  Physical Exam  General:  covering R eye with cloth; ambulates independently, no acute distress. Eyes:  RIGHT eye with conjunctival redness; pupils midsized and constrict appropriately with light (direct and indirect; symmetric both eyes).  Fluorescein testing reveals question of defect at 12 o'clock position just above iris of RIGHT eye.  Visual acuity checked bilat (no contact lenses): OS 20/60, OD  20/30.  Extra-ocular movements intact bilat. No fb visualized with RIGHT lid inversion.     Impression & Recommendations:  Problem # 1:  CORNEAL ABRASION, RIGHT (ICD-918.1)  Patient with history and exam that is consistent with right eye corneal abrasion. Vegetable matter; raises concern for bacterial keratitis.  Had been getting better before reintroducing contact lens.  To remain with contact lens out of eye; treatment with e-mycin oint four times daily for 5 days; patch R eye.  She is to come back for recheck in 2 days.  Call or come in sooner if worse; if not better (or if worse), then would refer for ophthalmologic evaluation.  Reassuring aspects of today's history/exam are the PERRL, EOMI, and the fact that there is a slight corneal defect noted on fluorescein.  Discussed plan with her.  Percocet for pain; she is advised not to drive or perform activities that require concentration with the pain med.   Orders: [facility]- Est Level  3 (42706)  Complete Medication List: 1)  Multivitamin  2)  Aloe Supplement  3)  Tyrosine Powder Supplement  4)  Ibuprofen 600 Mg Tabs (Ibuprofen) .... Take 1 tablet every 8 hours as needed for pain 5)  Prozac 10 Mg Caps (Fluoxetine hcl) .... Per dr. Kathrynn Running in [facility].  take one pill per day. 6)  Romycin 5 Mg/gm Oint (Erythromycin) .... Sig: apply 1/2 to  1 inch ribbon in right eye four times daily, for 5 days generic , ophthalmic preparation disp 1 tube quant suff 5 days 7)  Percocet 5-325 Mg Tabs (Oxycodone-acetaminophen) .Marland Kitchen.. 1 to 2 tabs by mouth every 6 hours as needed for extreme pain  Other Orders: [facility] ([address])  Patient Instructions: 1)  It was a pleasure to see you.  I believe your Right eye pain is due to a corneal abrasion, and there is the possibility of an early eye infection from the vegetable matter that entered your eye. 2)  I am prescribing you an eye ointment (erythromycin) to apply a 1/2 inch ribbon in the eye 4 times a day,  for 5 days.  Occlude the eye with an eye patch after applying the medicine.  3)  [facility] to [facility] on [address] 4)  DO NOT PUT YOUR CONTACT LENS IN THE RIGHT EYE UNTIL YOU COMPLETE THE TREATMENT WITH THE ERYTHROMYCIN AND THE PROBLEM IS RESOLVED. 5)  I would like you to come back in another 2 days for recheck; if this eye pain is worse, then please contact our office for help arranging an ophthalmology consultation.  Prescriptions: PERCOCET 5-325 MG TABS (OXYCODONE-ACETAMINOPHEN) 1 to 2 tabs by mouth every 6 hours as needed for extreme pain  #20 x 0   Entered and Authorized by:   Paula Compton MD   Signed by:   Paula Compton MD on 05/29/2010   Method used:   Print then Give to Patient   RxID:   [id number] ROMYCIN 5 MG/GM OINT (ERYTHROMYCIN) SIG: Apply 1/2 to 1 inch ribbon in RIGHT EYE four times daily, for 5 days GENERIC , ophthalmic preparation DISP 1 tube quant suff 5 days  #1 x 0   Entered and Authorized by:   Paula Compton MD   Signed by:   Paula Compton MD on 05/29/2010   Method used:   [facility] to        [facility]  [phone]* (retail)       [address] [place], [place]  [address]       Ph: [phone] or [phone]       Fax: [phone]   RxID:   [id number]    Orders Added: 1)  Vision- [facility] [33295] 2)  [facility]- Est Level  3 [18841]

## 2010-09-11 NOTE — Miscellaneous (Signed)
Summary: ROI  ROI   Imported By: Clydell Hakim 02/14/2010 16:06:08  _____________________________________________________________________  External Attachment:    Type:   Image     Comment:   External Document

## 2010-09-11 NOTE — Progress Notes (Signed)
Summary: Ref Req  Phone Note Call from Patient Call back at Home Phone [phone]   Caller: Patient Summary of Call: Needs referral to Dr. Corena Pilgrim for removal of something on her toe. Initial call taken by: Clydell Hakim,  January 24, 2010 2:25 PM  Follow-up for Phone Call        will forward to MD. Follow-up by: Theresia Lo RN,  January 29, 2010 12:43 PM  Additional Follow-up for Phone Call Additional follow up Details #1::        will do a full foot exam at next appt and discuss any necessary referrals at that time.  Ellin Mayhew MD  January 31, 2010 3:34 PM

## 2010-09-11 NOTE — Progress Notes (Signed)
Summary: RE: PT ..see note/TS  Phone Note Call from Patient Call back at Home Phone [phone]   Caller: Patient Summary of Call: Pt says she was to be referred for PT for her right shoulder. Initial call taken by: Clydell Hakim,  March 02, 2010 11:29 AM  Follow-up for Phone Call        called pt and lmvm to call back. please tell pt, that PT referral was faxed already. they will call her and sched. an appt. their number is: [phone], if pt wants to call them and check for appt. waiting for pt to call back. Follow-up by: Arlyss Repress CMA,,  March 02, 2010 12:00 PM      Prevention & Chronic Care Immunizations   Influenza vaccine: Not documented    Tetanus booster: Not documented    Pneumococcal vaccine: Not documented  Colorectal Screening   Hemoccult: Not documented    Colonoscopy: Not documented  Other Screening   Pap smear: Not documented    Mammogram: Not documented   Smoking status: never  (01/23/2010)  Lipids   Total Cholesterol: Not documented   LDL: Not documented   LDL Direct: Not documented   HDL: Not documented   Triglycerides: Not documented

## 2010-09-11 NOTE — Assessment & Plan Note (Signed)
Summary: COUGH X 5 DAYS   Vital Signs:  Patient profile:   50 year old female Weight:      216.7 pounds Temp:     98 degrees F oral Pulse rate:   80 / minute BP sitting:   110 / 83  (right arm)  Vitals Entered By: Arlyss Repress CMA, (June 07, 2010 9:47 AM) CC: cough x 1 week. Is Patient Diabetic? No Pain Assessment Patient in pain? no        Primary Care Rajiv Parlato:  Ellin Mayhew MD  CC:  cough x 1 week.Marland Kitchen  History of Present Illness: dry cough x 5 days: no wheezing, no mucous production, no ear or head pain, no fever, no n/v/d, no abd discomfort.  Pt states that she is unable to sleep due to the cough.  Habits & Providers  Alcohol-Tobacco-Diet     Tobacco Status: never  Current Medications (verified): 1)  Multivitamin 2)  Aloe Supplement 3)  Tyrosine Powder Supplement 4)  Ibuprofen 600 Mg Tabs (Ibuprofen) .... Take 1 Tablet Every 8 Hours As Needed For Pain 5)  Prozac 10 Mg Caps (Fluoxetine Hcl) .... [facility].  Take One Pill Per Day. 6)  Percocet 5-325 Mg Tabs (Oxycodone-Acetaminophen) .Marland Kitchen.. 1 To 2 Tabs By Mouth Every 6 Hours As Needed For Extreme Pain 7)  Tessalon Perles 100 Mg Caps (Benzonatate) .... Take Up To Three Times A Day  Allergies (verified): No Known Drug Allergies  Review of Systems       as per hpi  Physical Exam  General:  VSS Well-developed,well-nourished,in no acute distress; alert,appropriate and cooperative throughout examination Eyes:  No corneal or conjunctival inflammation noted. EOMI. Perrla.  Ears:  External ear exam shows no significant lesions or deformities.  Otoscopic examination reveals clear canals, tympanic membranes are intact bilaterally without bulging, retraction, inflammation or discharge. Hearing is grossly normal bilaterally. Nose:  External nasal examination shows no deformity or inflammation. Nasal mucosa are pink and moist without lesions or exudates. Mouth:  Oral mucosa and oropharynx  without lesions or exudates.  Teeth in good repair. Lungs:  Normal respiratory effort, chest expands symmetrically. Lungs are clear to auscultation, no crackles or wheezes.   Impression & Recommendations:  Problem # 1:  COUGH (ICD-786.2) most likely 2/2 viral process.  We discussed that coughing is a good thing to help an individual clear airways.  Pt requesting something to help since she can not sleep due to this symptoms.  Gave tessalon pearles to use as needed for cough while encouraging her to use them sparingly.   pt is to return if any new or worsening of symptoms.  Complete Medication List: 1)  Multivitamin  2)  Aloe Supplement  3)  Tyrosine Powder Supplement  4)  Ibuprofen 600 Mg Tabs (Ibuprofen) .... Take 1 tablet every 8 hours as needed for pain 5)  Prozac 10 Mg Caps (Fluoxetine hcl) .... Per dr. Kathrynn Running in [facility].  take one pill per day. 6)  Percocet 5-325 Mg Tabs (Oxycodone-acetaminophen) .Marland Kitchen.. 1 to 2 tabs by mouth every 6 hours as needed for extreme pain 7)  Tessalon Perles 100 Mg Caps (Benzonatate) .... Take up to three times a day Prescriptions: TESSALON PERLES 100 MG CAPS (BENZONATATE) take up to three times a day  #30 x 0   Entered and Authorized by:   Ellin Mayhew MD   Signed by:   Ellin Mayhew MD on 06/07/2010   Method used:  Print then Give to Patient   RxID:   [id number]

## 2010-09-11 NOTE — Assessment & Plan Note (Signed)
Summary: back pain/ preventative med   Vital Signs:  Patient profile:   50 year old female Weight:      228.1 pounds Temp:     98 degrees F oral Pulse rate:   69 / minute Pulse rhythm:   regular BP sitting:   137 / 86  (left arm) Cuff size:   large  Vitals Entered By: Loralee Pacas CMA (March 05, 2010 10:15 AM)  Primary Care Provider:  Ellin Mayhew MD   History of Present Illness: upper back pain: workout out on friday with machine to exerise upper back, pain now in upper back since doing these exercises.  Pain 8/10.  Also endorses h/a since injuring these muscles in her upper back. Rochele Pages advil on saturday 600mg .  No relief.  Decreased sleep since friday.  Wants to make sure that she hasn't "hurt anything"  finianial concerns: Also wanted to let me know that her medicaid is running out and wants to know who to talk to at hosital about our sliding scale fee program.    -  Date:  03/11/2009    PAP per pt normal- no hx of abnormal    Mammogram per pt. -normal  Current Medications (verified): 1)  Paxil 20 Mg Tabs (Paroxetine Hcl) 2)  Multivitamin 3)  Aloe Supplement 4)  Tyrosine Powder Supplement 5)  Ibuprofen 600 Mg Tabs (Ibuprofen) .... Take 1 Tablet Every 8 Hours As Needed For Pain  Allergies (verified): No Known Drug Allergies  Review of Systems       as per hpi  Physical Exam  General:  VSS Well-developed,well-nourished,in no acute distress; alert,appropriate and cooperative throughout examination Msk:  tenderness on palpation of upper back area at base of neck.   Extremities:  no edema.  strength equal bilateral.  Neurologic:  sensation equal bilateral.  reflexes equal bilat.  Skin:  no rashes   Impression & Recommendations:  Problem # 1:  BACK PAIN, UPPER (ICD-724.5) Pt to continue to take motrin for discomfort.  PT to see pt to evaluate arm as per last visit.  Pt given red flags and is to return as needed.  Will consider giving  a muscle relaxer if  upper back pain and h/a doesn't resolve within the next 2-3 days and no red flags occur.  Her updated medication list for this problem includes:    Ibuprofen 600 Mg Tabs (Ibuprofen) .Marland Kitchen... Take 1 tablet every 8 hours as needed for pain  Orders: [facility]- Est Level  3 (62130)  Problem # 2:  Preventive Health Care (ICD-V70.0) [facility] formed reviewed and updated. Will ensure that pt scheduled for colonoscopy at next appt.  Complete Medication List: 1)  Paxil 20 Mg Tabs (Paroxetine hcl) 2)  Multivitamin  3)  Aloe Supplement  4)  Tyrosine Powder Supplement  5)  Ibuprofen 600 Mg Tabs (Ibuprofen) .... Take 1 tablet every 8 hours as needed for pain  Other Orders: [facility] ([id number]) Colonoscopy (Colon)  Patient Instructions: 1)  Call Rudell Cobb- Can call through main [phone] 2)  physical therapy-  [phone] 3)   if pt wants to call them and check for appt.    Prevention & Chronic Care Immunizations   Influenza vaccine: Not documented   Influenza vaccine deferral: Not available  (03/05/2010)    Tetanus booster: Not documented    Pneumococcal vaccine: Not documented  Colorectal Screening   Hemoccult: Not documented   Hemoccult action/deferral: Not indicated  (03/05/2010)    Colonoscopy: Not documented  Colonoscopy action/deferral: GI Referral  (03/05/2010)  Other Screening   Pap smear: per pt normal- no hx of abnormal  (03/11/2009)   Pap smear action/deferral: Deferred-3 yr interval  (03/05/2010)   Pap smear due: 03/11/2012    Mammogram: per pt. -normal  (03/11/2009)   Mammogram action/deferral: Ordered  (03/05/2010)   Mammogram due: 03/10/2010   Smoking status: never  (01/23/2010)  Lipids   Total Cholesterol: Not documented   LDL: Not documented   LDL Direct: Not documented   HDL: Not documented   Triglycerides: Not documented   Nursing Instructions: Screening colonoscopy ordered

## 2010-09-11 NOTE — Assessment & Plan Note (Signed)
Summary: Behavioral Medicine Follow-up   Primary Care Provider:  Ellin Mayhew MD   History of Present Illness: Yobana reports she attempted to come off the Prozac but noticed a significant dip in mood and motivation after stopping the medicine.  She said she attempted to manage this for a bout a week and a half before restarting the medicine.  Things have since stabilized.  Her thoughts were negative (feeling like a failure, that nobody wants her - job, husband), she did not want to be around people, her excercise decreased and she had to force herself to do some basic household chores.  She does not wish to be on a medication but is questioning this now.  She especially does not want to be on Prozac because she has SOB when exercising.  Discussed options.  She is helping her mother a lot and getting ready for her mom to stay with her for furniture market.  Her mom had polio and is disabled - now in a wheelchair.  This has always been a tough relationship.  Daneshia works as hard as she does out of a sense of respect and "you reap what you sow" but it is tough on her.  Phyllis is stressing about Lizzy's lack of motivation to address college.  She is working hard not to fix it for [employer] but she did not have this difficulty with [employer] and it is stressful.  Lizzy is not feeling well right now so she is hopeful this might swtich if she starts to feel better.  Allergies: No Known Drug Allergies   Impression & Recommendations:  Problem # 1:  DEPRESSIVE DISORDER NOT ELSEWHERE CLASSIFIED (ICD-311) Discussed options regarding medication.  Experimenting with coming off the Prozac again to see if the same thing happens is one option.  Zakayla does not think she could do that until after the first of the year when her demands might decrease).  Other options include switching to another SSRI that might not have the same side effect.  Discussed both Zoloft and Lexapro.  She will do some research and talk with her  physician.  Exercise should help attenuate the stress she is experiencing with her mother and daughter.  She has regained some motivation and plans to exercise every other day this week.  She does not think this will be difficult.  She elected to follow-up in mid-November.  I asked her to call in the meantime if anything comes up.   Her updated medication list for this problem includes:    Prozac 10 Mg Caps (Fluoxetine hcl) .Marland Kitchen... Per dr. Kathrynn Running in [facility].  take one pill per day.  Orders: Therapy 40-50- min- [facility] ([address]109)  Complete Medication List: 1)  Multivitamin  2)  Aloe Supplement  3)  Tyrosine Powder Supplement  4)  Ibuprofen 600 Mg Tabs (Ibuprofen) .... Take 1 tablet every 8 hours as needed for pain 5)  Prozac 10 Mg Caps (Fluoxetine hcl) .... Per dr. Kathrynn Running in [facility].  take one pill per day.

## 2010-09-17 ENCOUNTER — Ambulatory Visit (INDEPENDENT_AMBULATORY_CARE_PROVIDER_SITE_OTHER): Payer: Self-pay

## 2010-09-17 ENCOUNTER — Inpatient Hospital Stay (INDEPENDENT_AMBULATORY_CARE_PROVIDER_SITE_OTHER)
Admission: RE | Admit: 2010-09-17 | Discharge: 2010-09-17 | Disposition: A | Discharge status: Home / Self Care | Payer: Self-pay | Source: Ambulatory Visit | Attending: Family Medicine | Admitting: Family Medicine

## 2010-09-17 DIAGNOSIS — S93409A Sprain of unspecified ligament of unspecified ankle, initial encounter: Secondary | ICD-10-CM

## 2010-09-17 DIAGNOSIS — IMO0002 Reserved for concepts with insufficient information to code with codable children: Secondary | ICD-10-CM

## 2010-09-18 ENCOUNTER — Encounter: Payer: Self-pay | Admitting: *Deleted

## 2010-10-25 ENCOUNTER — Ambulatory Visit: Payer: Self-pay | Admitting: Family Medicine

## 2010-11-13 ENCOUNTER — Ambulatory Visit: Payer: Self-pay | Admitting: Family Medicine

## 2010-11-17 ENCOUNTER — Inpatient Hospital Stay (HOSPITAL_COMMUNITY)
Admission: RE | Admit: 2010-11-17 | Discharge: 2010-11-17 | Disposition: A | Discharge status: Home / Self Care | Payer: Self-pay | Source: Ambulatory Visit | Attending: Family Medicine | Admitting: Family Medicine

## 2010-11-17 ENCOUNTER — Inpatient Hospital Stay (HOSPITAL_COMMUNITY): Admission: RE | Admit: 2010-11-17 | Payer: Self-pay | Source: Ambulatory Visit

## 2010-11-18 ENCOUNTER — Inpatient Hospital Stay (INDEPENDENT_AMBULATORY_CARE_PROVIDER_SITE_OTHER)
Admission: RE | Admit: 2010-11-18 | Discharge: 2010-11-18 | Disposition: A | Discharge status: Home / Self Care | Payer: Self-pay | Source: Ambulatory Visit | Attending: Emergency Medicine | Admitting: Emergency Medicine

## 2010-11-18 DIAGNOSIS — R05 Cough: Secondary | ICD-10-CM

## 2010-11-18 DIAGNOSIS — J029 Acute pharyngitis, unspecified: Secondary | ICD-10-CM

## 2010-11-18 LAB — POCT RAPID STREP A (OFFICE): Streptococcus, Group A Screen (Direct): NEGATIVE

## 2010-12-06 ENCOUNTER — Ambulatory Visit: Payer: Self-pay | Admitting: Family Medicine

## 2010-12-20 ENCOUNTER — Ambulatory Visit: Payer: Self-pay | Admitting: Family Medicine

## 2011-04-03 ENCOUNTER — Other Ambulatory Visit (HOSPITAL_COMMUNITY)
Admission: RE | Admit: 2011-04-03 | Discharge: 2011-04-03 | Disposition: A | Discharge status: Home / Self Care | Payer: Self-pay | Source: Ambulatory Visit | Attending: Family Medicine | Admitting: Family Medicine

## 2011-04-03 ENCOUNTER — Encounter: Payer: Self-pay | Admitting: Family Medicine

## 2011-04-03 ENCOUNTER — Ambulatory Visit (INDEPENDENT_AMBULATORY_CARE_PROVIDER_SITE_OTHER): Payer: Self-pay | Admitting: Family Medicine

## 2011-04-03 VITALS — BP 133/86 | HR 69 | Ht 63.0 in | Wt 204.0 lb

## 2011-04-03 DIAGNOSIS — N898 Other specified noninflammatory disorders of vagina: Secondary | ICD-10-CM

## 2011-04-03 DIAGNOSIS — Z0000 Encounter for general adult medical examination without abnormal findings: Secondary | ICD-10-CM | POA: Insufficient documentation

## 2011-04-03 DIAGNOSIS — Z01419 Encounter for gynecological examination (general) (routine) without abnormal findings: Secondary | ICD-10-CM | POA: Insufficient documentation

## 2011-04-03 DIAGNOSIS — Z7189 Other specified counseling: Secondary | ICD-10-CM

## 2011-04-03 DIAGNOSIS — N939 Abnormal uterine and vaginal bleeding, unspecified: Secondary | ICD-10-CM

## 2011-04-03 DIAGNOSIS — E669 Obesity, unspecified: Secondary | ICD-10-CM

## 2011-04-03 DIAGNOSIS — Z124 Encounter for screening for malignant neoplasm of cervix: Secondary | ICD-10-CM

## 2011-04-03 DIAGNOSIS — R05 Cough: Secondary | ICD-10-CM

## 2011-04-03 DIAGNOSIS — F329 Major depressive disorder, single episode, unspecified: Secondary | ICD-10-CM

## 2011-04-03 LAB — COMPREHENSIVE METABOLIC PANEL
AST: 19 U/L (ref 0–37)
Alkaline Phosphatase: 70 U/L (ref 39–117)
BUN: 17 mg/dL (ref 6–23)
Creat: 0.86 mg/dL (ref 0.50–1.10)
Potassium: 4.4 mEq/L (ref 3.5–5.3)
Total Bilirubin: 0.3 mg/dL (ref 0.3–1.2)

## 2011-04-03 LAB — POCT URINE PREGNANCY: Preg Test, Ur: NEGATIVE

## 2011-04-03 NOTE — Progress Notes (Signed)
  Subjective:    Patient ID: Brooke Clarke, female    DOB: 27-Jul-1961, 50 y.o.   MRN: [id number]  HPI Menopause? And vaginal bleeding- No period x 1 1/2 with no period.  End of July x 2 days had light spotting.  This occurred a day or so after having sex.  Has not been sexually active in "a long time."   No abd pain. No fever. No chages in bowel and bladder.  + weight loss but has been trying to lose weight through diet and exercise.  Requests testing to see if she has gone through menopause.   Pap smear: States she is due for pap. No h/o abnormal.  Offered STD screening.  Pt declined at this time.   Cough: 2 weeks,Occasionally productive. No blood in sputum.  No fever.  No chills, or nighttime sweating. + sick contact.  Depression: lizzy graduated,new job, new boyfriend, exercising daily since middle of June.  Lost 25 lbs.  PHQ-9 score of 1.   SH: pt now sexually active with boyfriend.        Review of Systems As per above    Objective:   Physical Exam  Constitutional: She is oriented to person, place, and time. She appears well-developed and well-nourished.  Cardiovascular: Normal rate, regular rhythm and normal heart sounds.   No murmur heard. Pulmonary/Chest: Effort normal and breath sounds normal. No respiratory distress. She has no wheezes. She has no rales.  Abdominal: Soft. She exhibits no distension. There is no tenderness. There is no rebound and no guarding.  Genitourinary: Vagina normal. There is no rash, tenderness or lesion on the right labia. There is no tenderness or lesion on the left labia. Cervix exhibits discharge. Cervix exhibits no motion tenderness and no friability. Right adnexum displays no mass, no tenderness and no fullness. Left adnexum displays no mass, no tenderness and no fullness.       Some vaginal dryness on exam  Musculoskeletal: She exhibits no edema.  Neurological: She is alert and oriented to person, place, and time.  Skin: No rash noted.    Psychiatric: She has a normal mood and affect. Her behavior is normal.       PHQ-9 score of 1          Assessment & Plan:

## 2011-04-03 NOTE — Patient Instructions (Signed)
I will mail you your results in the mail. Return if any further vaginal bleeding. Consider using [place] silke-  For lubrication.  Return in 3 months for follow up.

## 2011-04-04 DIAGNOSIS — R059 Cough, unspecified: Secondary | ICD-10-CM | POA: Insufficient documentation

## 2011-04-04 DIAGNOSIS — R05 Cough: Secondary | ICD-10-CM | POA: Insufficient documentation

## 2011-04-04 LAB — LUTEINIZING HORMONE: LH: 30.3 m[IU]/mL

## 2011-04-04 LAB — CBC
HCT: 42.5 % (ref 36.0–46.0)
Hemoglobin: 13.9 g/dL (ref 12.0–15.0)
MCH: 29.6 pg (ref 26.0–34.0)
MCHC: 32.7 g/dL (ref 30.0–36.0)
MCV: 90.4 fL (ref 78.0–100.0)
RBC: 4.7 MIL/uL (ref 3.87–5.11)

## 2011-04-04 LAB — FOLLICLE STIMULATING HORMONE: FSH: 33.3 m[IU]/mL

## 2011-04-04 NOTE — Assessment & Plan Note (Addendum)
Pap smear obtained and sent to lab. Pt requests testing to see if she is in menopause.  Will test fsh and lh.  Discussed this with preceptor.

## 2011-04-04 NOTE — Assessment & Plan Note (Signed)
Most likely viral etiology.  Symptomatic treatment with otc prn.  Drink lots of fluids.  PE wnl.

## 2011-04-04 NOTE — Assessment & Plan Note (Signed)
Vaginal spotting x 2 days after sex most likely related to vaginal dryness in the setting of menopause.  Pt to use lubricant.  If continued spotting pt is to return for further workup.

## 2011-04-04 NOTE — Assessment & Plan Note (Signed)
25 lb weight loss with diet and exercise

## 2011-04-04 NOTE — Assessment & Plan Note (Signed)
PHQ9 score of 1, new boyfriend, new job, losing weight, exercising daily. Not taking medication for depression

## 2011-04-08 ENCOUNTER — Encounter: Payer: Self-pay | Admitting: Family Medicine

## 2011-06-25 ENCOUNTER — Ambulatory Visit (INDEPENDENT_AMBULATORY_CARE_PROVIDER_SITE_OTHER): Payer: Managed Care, Other (non HMO) | Admitting: Family Medicine

## 2011-06-25 VITALS — BP 142/86 | HR 67 | Temp 98.3°F | Ht 63.0 in | Wt 191.0 lb

## 2011-06-25 DIAGNOSIS — B070 Plantar wart: Secondary | ICD-10-CM

## 2011-06-25 DIAGNOSIS — R350 Frequency of micturition: Secondary | ICD-10-CM

## 2011-06-25 LAB — POCT UA - MICROSCOPIC ONLY

## 2011-06-25 LAB — POCT URINALYSIS DIPSTICK
Bilirubin, UA: NEGATIVE
Ketones, UA: NEGATIVE
Protein, UA: 100
Spec Grav, UA: 1.025
pH, UA: 7

## 2011-06-25 MED ORDER — CEPHALEXIN 500 MG PO CAPS: 500.0000 mg | ORAL_CAPSULE | Freq: Two times a day (BID) | ORAL | Status: AC

## 2011-06-25 NOTE — Assessment & Plan Note (Signed)
UA and micro consistent with UTI, will send for culture.  Will treat empircally with cephalexin x5 days.  Given red flags.

## 2011-06-25 NOTE — Patient Instructions (Signed)
It looks like you have a urinary tract infection.  I am giving you an antibiotic called cephalexin, take until it is all gone.  If you notice worsening of symptoms, fever, chills, or low back pain please call us back.  For your toe I hope the refreezing helps.  You may notice a blister form around the area tonight or tomorrow, this is normal.  If you have questions feel free to call us.

## 2011-06-25 NOTE — Progress Notes (Signed)
  Subjective:    Patient ID: Brooke Clarke, female    DOB: [birth year], 50 y.o.   MRN: [id number]  HPI 1. Dysuria/Urinary Frequency: Complains of urinary frequency, urgency and dysuria x 3 days, without flank pain, fever, chills, or abnormal vaginal discharge or bleeding.  Has noticed some blood in her urine.  No STI exposure that she is aware of.  2.  Wart:  Has wart on 2nd toe of R foot, painful at times when her feet are cold.  She has had this area frozen in the past but wart has returned.  Would like to have this taken care of today.  She has has been using OTC wart remover but little help.     Review of Systems     Objective:   Physical Exam  Constitutional: She appears well-developed and well-nourished. No distress.  Abdominal: Soft. Bowel sounds are normal. There is tenderness in the suprapubic area. There is no CVA tenderness.  Skin:       Wart on medial aspect on 2nd toe on R foot.            Assessment & Plan:

## 2011-06-25 NOTE — Assessment & Plan Note (Signed)
Thick keratin buildup removed with #15 blade and area frozen.  Given post care instructions.

## 2011-06-28 LAB — URINE CULTURE

## 2011-07-18 ENCOUNTER — Ambulatory Visit (INDEPENDENT_AMBULATORY_CARE_PROVIDER_SITE_OTHER): Payer: Managed Care, Other (non HMO) | Admitting: Family Medicine

## 2011-07-18 ENCOUNTER — Encounter: Payer: Self-pay | Admitting: Family Medicine

## 2011-07-18 VITALS — BP 129/71 | HR 75 | Temp 98.1°F | Ht 63.0 in | Wt 190.2 lb

## 2011-07-18 DIAGNOSIS — N766 Ulceration of vulva: Secondary | ICD-10-CM

## 2011-07-18 DIAGNOSIS — R300 Dysuria: Secondary | ICD-10-CM

## 2011-07-18 LAB — POCT URINALYSIS DIPSTICK
Bilirubin, UA: NEGATIVE
Blood, UA: NEGATIVE
Ketones, UA: NEGATIVE
Protein, UA: NEGATIVE
pH, UA: 5.5

## 2011-07-18 LAB — POCT UA - MICROSCOPIC ONLY: Epithelial cells, urine per micros: >20

## 2011-07-18 MED ORDER — FLUCONAZOLE 150 MG PO TABS: 150.0000 mg | ORAL_TABLET | Freq: Once | ORAL | Status: AC

## 2011-07-18 NOTE — Patient Instructions (Signed)
We will treat you for a yeast infection.  It is really common to have a yeast infection after taking a course of antibiotics.   I will let you know when the other test results come back.  Please let us know if you have any questions.

## 2011-07-19 DIAGNOSIS — B009 Herpesviral infection, unspecified: Secondary | ICD-10-CM | POA: Insufficient documentation

## 2011-07-19 HISTORY — DX: Herpesviral infection, unspecified: B00.9

## 2011-07-19 NOTE — Assessment & Plan Note (Signed)
Concerning for HSV.  Given recent h/o abx, and symptoms of itching, will treat for possible yeast infection.  Await viral culture.

## 2011-07-19 NOTE — Progress Notes (Signed)
  Subjective:    Patient ID: Brooke Clarke, female    DOB: [birth year], 50 y.o.   MRN: [id number]  HPI 30 you female who got married 8 weeks ago and was recently treated to a UTI with Cephalexin returns for different symptoms.  She reports "raw" feeling of external genitalia.  Concerned her UTI is back because she has burning with urination.  No frequency.  + mild vaginal itching.  Did have diarrhea last week, which she attributes to eating too many beans.  She is very uncomfortable when sitting, and she tried miconazole and Desitin to the area.  She did complete her abx and felt improvement in her urinary sx, but then had onset of above symptoms.  Denies vaginal discharge.    Review of SystemsSee HPI     Objective:   Physical Exam  Constitutional: She appears well-developed and well-nourished. No distress.  Genitourinary:       External genitalia: Nl female.  +small, tender ulcerated lesion L labia major with erythematous base.  No other lesions noted.  + Perianal erythema with well demarcated borders. Scant white vaginal discharge noted. Viral culture of lesion done.  Skin: She is not diaphoretic.          Assessment & Plan:

## 2011-07-23 ENCOUNTER — Telehealth: Payer: Self-pay | Admitting: Family Medicine

## 2011-07-23 MED ORDER — HYDROCORTISONE ACE-PRAMOXINE 2.35-1 % RE CREA: TOPICAL_CREAM | RECTAL | Status: DC

## 2011-07-23 NOTE — Telephone Encounter (Signed)
Left message for pt to call us about some test results.  I did not leave the results on her VM.

## 2011-07-23 NOTE — Telephone Encounter (Signed)
Spoke with pt.  She is still having a lot of pain, which she thinks is due to hemorrhoids.  We discussed HSV-2 results.  She will look for other lesions, and if outbreak continues, will call us and we can start valcyclovir.  Will also call in rx for stronger hemorrhoid cream.   We discussed the implications the HSV-2.  She will discuss with her husband tonight.  She will call us for any further questions.

## 2011-08-07 ENCOUNTER — Encounter: Payer: Self-pay | Admitting: Family Medicine

## 2011-08-07 ENCOUNTER — Ambulatory Visit (INDEPENDENT_AMBULATORY_CARE_PROVIDER_SITE_OTHER): Payer: Managed Care, Other (non HMO) | Admitting: Family Medicine

## 2011-08-07 VITALS — BP 121/83 | HR 69 | Temp 98.8°F

## 2011-08-07 DIAGNOSIS — J069 Acute upper respiratory infection, unspecified: Secondary | ICD-10-CM | POA: Insufficient documentation

## 2011-08-07 MED ORDER — IBUPROFEN 600 MG PO TABS: 600.0000 mg | ORAL_TABLET | Freq: Four times a day (QID) | ORAL | Status: DC | PRN

## 2011-08-07 MED ORDER — GUAIFENESIN-CODEINE 100-10 MG/5ML PO SYRP: 5.0000 mL | ORAL_SOLUTION | Freq: Three times a day (TID) | ORAL | Status: AC | PRN

## 2011-08-07 MED ORDER — ONDANSETRON HCL 4 MG PO TABS: 4.0000 mg | ORAL_TABLET | Freq: Three times a day (TID) | ORAL | Status: AC | PRN

## 2011-08-07 NOTE — Patient Instructions (Signed)
I am sorry you are sick over the holidays. I think you have a viral infection  Take ibuprofen 600 mg and Tylenol 650 mg every 4 hours alternating for the next 2 days then take as needed. Take the cough medicine with codeine at bedtime.  Take the Zofran for nausea as needed Drink plenty of fluids (8-10 cups daily). Get plenty of rest. I would recommend you stay at home until Monday.   Follow-up as needed. Concerning symptoms: fevers 100.4 or greater that persist until next week, difficulty breathing. If you have persistent symptoms by next week, please make an appointment to be re-evaluated at that time.

## 2011-08-07 NOTE — Assessment & Plan Note (Signed)
Viral symptoms since 12/24. No fevers but many other flu-like symptoms. Discussed with patient and agreed to treat symptomatically (no Tamiflu) with Tylenol/ibuprofen, cough medication. Follow-up prn.

## 2011-08-07 NOTE — Progress Notes (Signed)
  Subjective:    Patient ID: Brooke Clarke, female    DOB: 11/03/1960, 50 y.o.   MRN: [id number]  HPI Acute visit:  Congestion, body aches, chills but no fever Duration: started 12/24 Progression worsening: Medications: has tried Tylenol, ibuprofen, OTC sinus and cough medications. No significant relief with any medications.  Exacerbated by: nothing Concerns: requesting something for cough  Review of Systems Shoulder and back pain  Chills Sore throat but tolerating diet Decreased appetite Denies difficulty breathing Occasional nausea without vomiting    Objective:   Physical Exam Gen: appears uncomfortable and ill, lying on exam table when I entered room, accompanied by husband HEENT  Head: AT/[place]  Eyes: no conjunctival redness or injection  Ears: TM normal bilaterally  Nose: nasal congestion, clear nasal discharge   Throat: no tonsillar adenopathy or oropharyngeal erythema or lesions  Neck: no LAD CV: RRR, no m/r/g Pulm: NI WOB, CTAB without w/r/r Abd: soft, non-tender Back: mild TTP between shoulder blades and diffusely in lumbar region    Assessment & Plan:

## 2011-08-14 ENCOUNTER — Telehealth: Payer: Self-pay | Admitting: Family Medicine

## 2011-08-14 NOTE — Telephone Encounter (Signed)
Patient is calling back again.  Really wants to speak with Dr. Madolyn Frieze because the coughing is so bad at night she can't sleep.

## 2011-08-14 NOTE — Telephone Encounter (Signed)
Called pt. Pt reports that she is still coughing. Advised pt to may have OV, Robitussin with Codeine was prescribed by Dr.Oh Park at her last visit. Pt said, that she is unable to come in due to her work situation. Pt denies fever, SOB...just coughing worse during night. I told the pt that I would send a message to Dr. Madolyn Frieze for review. Lorenda Hatchet, Renato Battles

## 2011-08-14 NOTE — Telephone Encounter (Signed)
The cough medicine that was prescribed is not working and she would like to talk to someone about trying something different.

## 2011-08-15 MED ORDER — BENZONATATE 200 MG PO CAPS: 200.0000 mg | ORAL_CAPSULE | Freq: Three times a day (TID) | ORAL | Status: AC | PRN

## 2011-08-15 NOTE — Telephone Encounter (Signed)
No answer. Left message. Will send Rx for Tessalon. Recommended hot drinks with honey. If still not improved, strongly recommended she come in to be seen in clinic.

## 2011-08-19 ENCOUNTER — Ambulatory Visit (INDEPENDENT_AMBULATORY_CARE_PROVIDER_SITE_OTHER): Payer: Managed Care, Other (non HMO) | Admitting: Family Medicine

## 2011-08-19 ENCOUNTER — Encounter: Payer: Self-pay | Admitting: Family Medicine

## 2011-08-19 DIAGNOSIS — J019 Acute sinusitis, unspecified: Secondary | ICD-10-CM | POA: Insufficient documentation

## 2011-08-19 DIAGNOSIS — B9689 Other specified bacterial agents as the cause of diseases classified elsewhere: Secondary | ICD-10-CM

## 2011-08-19 DIAGNOSIS — R05 Cough: Secondary | ICD-10-CM

## 2011-08-19 DIAGNOSIS — A499 Bacterial infection, unspecified: Secondary | ICD-10-CM

## 2011-08-19 DIAGNOSIS — J329 Chronic sinusitis, unspecified: Secondary | ICD-10-CM

## 2011-08-19 MED ORDER — IPRATROPIUM BROMIDE HFA 17 MCG/ACT IN AERS: 3.0000 | INHALATION_SPRAY | Freq: Four times a day (QID) | RESPIRATORY_TRACT | Status: DC

## 2011-08-19 MED ORDER — HYDROCOD POLST-CHLORPHEN POLST 10-8 MG/5ML PO LQCR: 5.0000 mL | Freq: Two times a day (BID) | ORAL | Status: DC | PRN

## 2011-08-19 MED ORDER — AZITHROMYCIN 500 MG PO TABS: ORAL_TABLET | ORAL | Status: AC

## 2011-08-19 NOTE — Patient Instructions (Signed)
Start Azithromycin antibiotic one pill a day for 7 days to treat possible acute sinusitis.  Inhale 3 puffs of Atrovent (ipatropium) four times a day for next 2 weeks for cough  Take 1 teaspoon (5 mL) of Tussionex cough syrup at bedtime to help reduce cough. Do not operate heavy equeipment or drive car until you know how the Tussionex effects you.  Recommend taking 3 days off from work for bedrest, plenty of fluids, and take medications.

## 2011-08-19 NOTE — Progress Notes (Signed)
  Subjective:     Brooke Clarke is a 51 y.o. female here for evaluation of a cough. Onset of symptoms was 2 weeks ago. Symptoms have been unchanged since that time. The cough is paroxysmal and is aggravated by reclining position. Associated symptoms include: change in voice and sputum production. Patient does not have a history of asthma. Patient does not have a history of environmental allergens. Patient has not traveled recently. Patient does not have a history of smoking. Patient has not had a previous chest x-ray.   Review of Systems Constitutional: negative for fevers Ears, nose, mouth, throat, and face: positive for hoarseness, nasal congestion, sore mouth and pressure feeling in ears Respiratory: positive for cough and sputum, negative for dyspnea on exertion, emphysema, hemoptysis and wheezing    Objective:      Assessment:     Plan:      Subjective:     Brooke Clarke is a 51 y.o. female here for evaluation of a cough. Onset of symptoms was 2 week ago. Symptoms have been unchanged since that time. The cough is nocturnal, paroxysmal and productive and is aggravated by reclining position. Associated symptoms include: change in voice and sputum production. Patient does not have a history of asthma. Patient does not have a history of environmental allergens. Patient has not traveled recently. Patient does not have a history of smoking. Patient has not had a previous chest x-ray.    Review of Systems Ears, nose, mouth, throat, and face: positive for nasal congestion, sore throat and voice change Respiratory: positive for sputum, negative for asthma, dyspnea on exertion, hemoptysis, pleurisy/chest pain and wheezing    Objective:     BP 127/81  Pulse 90  Temp(Src) 97.9 F (36.6 C) (Oral)  Ht 5\' 3"  (1.6 m) General appearance: alert, cooperative and no distress Head: Normocephalic, without obvious abnormality, atraumatic, sinuses tender to percussion Eyes: negative,  conjunctivae/corneas clear. PERRL, EOM's intact. Ears: normal TM's and external ear canals both ears Nose: Nares normal. Septum midline. Mucosa normal. No drainage or sinus tenderness. Throat: lips, mucosa, and tongue normal; teeth and gums normal Neck: no adenopathy, no carotid bruit, no JVD, supple, symmetrical, trachea midline and thyroid not enlarged, symmetric, no tenderness/mass/nodules Lungs: clear to auscultation bilaterally Heart: regular rate and rhythm, S1, S2 normal, no murmur, click, rub or gallop    Assessment:    Sinusitis  Postinfectious Cough   Plan:    Antibiotics per medication orders. Antitussives per medication orders. Call if shortness of breath worsens, blood in sputum, change in character of cough, development of fever or chills, inability to maintain nutrition and hydration. Avoid exposure to tobacco smoke and fumes. Atrovent 3 puffs QID for two weeks for postinfectious cough  Out of work for next 3 days.  Rest, hot teas. Acetaminophen prn.

## 2011-08-27 ENCOUNTER — Other Ambulatory Visit: Payer: Self-pay | Admitting: Surgery

## 2011-10-25 ENCOUNTER — Ambulatory Visit: Payer: Managed Care, Other (non HMO) | Admitting: Family Medicine

## 2012-02-27 ENCOUNTER — Other Ambulatory Visit: Payer: Self-pay | Admitting: Family Medicine

## 2012-02-27 DIAGNOSIS — Z1231 Encounter for screening mammogram for malignant neoplasm of breast: Secondary | ICD-10-CM

## 2012-03-03 ENCOUNTER — Ambulatory Visit: Payer: Managed Care, Other (non HMO)

## 2012-03-03 DIAGNOSIS — M722 Plantar fascial fibromatosis: Secondary | ICD-10-CM | POA: Insufficient documentation

## 2012-07-16 ENCOUNTER — Encounter: Payer: Self-pay | Admitting: Family Medicine

## 2012-07-17 ENCOUNTER — Encounter: Payer: Self-pay | Admitting: Family Medicine

## 2012-07-17 ENCOUNTER — Ambulatory Visit (INDEPENDENT_AMBULATORY_CARE_PROVIDER_SITE_OTHER): Payer: Managed Care, Other (non HMO) | Admitting: Family Medicine

## 2012-07-17 VITALS — BP 142/84 | HR 73 | Temp 98.4°F | Ht 63.0 in | Wt 200.1 lb

## 2012-07-17 DIAGNOSIS — M5430 Sciatica, unspecified side: Secondary | ICD-10-CM

## 2012-07-17 DIAGNOSIS — G479 Sleep disorder, unspecified: Secondary | ICD-10-CM

## 2012-07-17 DIAGNOSIS — M5432 Sciatica, left side: Secondary | ICD-10-CM

## 2012-07-17 MED ORDER — MELOXICAM 15 MG PO TABS: 15.0000 mg | ORAL_TABLET | Freq: Every day | ORAL | Status: DC

## 2012-07-17 MED ORDER — CYCLOBENZAPRINE HCL 5 MG PO TABS: 5.0000 mg | ORAL_TABLET | Freq: Every evening | ORAL | Status: DC | PRN

## 2012-07-17 NOTE — Patient Instructions (Signed)
Please take Mobic once daily for inflammation. Take the flexeril as needed for sleep if the sleep is due to pain. Otherwise, please continue stretching and return in 4 weeks.   Take Care,   Dr. Clinton Sawyer

## 2012-07-17 NOTE — Progress Notes (Signed)
Subjective:     Patient ID: Brooke Clarke, female   DOB: 03/25/1961, 51 y.o.   MRN: [id number]  HPI 1. Back/leg pain: She presents with a 2 month history of pain that starts in her lower back/gluteus medius on her left side and radiates posteriorly to her malleolus. The pain is worse at night and can be a 7/10.  The pain wakes her up at night.  The pain is currently a 2-3/10.  The pain is a dull ache.  She started a more vigorous exercise program 2 months ago in order to lose weight. Her current exercise regimine includes riding the elliptical for 60 minutes 2-3 times per week.  She also does weight training and stretching after each work out.    2. Sleep Disturbance - Difficulty falling asleep at night; started several weeks ago when she started phentermine as prescribed by her bariatric doctor; also having pain at night that inhibits sleep; patient has tried husbands temazepam for sleep and is requesting her own prescription for sleep aid  Review of Systems Within normal limits other than noted above    Objective:   Physical Exam General: NAD Neuro: oriented x 3, alert  HEENT: EOMI, PERRLA, NCAT  Cardio: S1S2, RRR, no murmurs  Resp: CTA, no rhonchi, rales or wheezes  Ab: +BS, Soft, no rebound, guarding or rigidity  MSK: no pain on palpation of the lower back or gluteus medius.  Negative straight leg test  Back: normal range of motion in rotation, flexion, extension, side bends.   Hip: normal ROM, flexion to 90 degrees. 5/5 strength in withdrawal to adduction  Extremities: 5/5 in extension and flexion of the knee  Neuro: sensation in tact to lower extremities bilaterally; 2+ patellar reflex      Assessment:     1. Sciatica vs muscle strain 2. Sleep disturbance, multifactorial     Plan:       I have seen seen the patient and edited the note in bold. I developed and documented the assessment and plan.    Si Raider Clinton Sawyer, MD, MBA 07/19/2012, 4:23 PM Family Medicine Resident,  PGY-2 [phone] pager

## 2012-07-19 DIAGNOSIS — G479 Sleep disorder, unspecified: Secondary | ICD-10-CM | POA: Insufficient documentation

## 2012-07-19 DIAGNOSIS — M5432 Sciatica, left side: Secondary | ICD-10-CM | POA: Insufficient documentation

## 2012-07-19 NOTE — Assessment & Plan Note (Signed)
Seem multifactorial, mostly from stimulant and partially from pain. Given rx for flexeril to help control pain. Counseled on the danger of prescribing a benzodiazepine to make side effects from stimulant medication and encouraged patient to discuss discontinuing /dosage adjustment of stimulant with bariatric doctor.

## 2012-07-19 NOTE — Assessment & Plan Note (Addendum)
Patient likely with mild sciatica and no red flags for neurologic compromise. Stretching regimen provided. Taking Mobic daily for 2 weeks. Take flexeril QHS PRN for pain preventing sleep.

## 2012-07-21 ENCOUNTER — Telehealth: Payer: Self-pay | Admitting: Family Medicine

## 2012-07-21 NOTE — Telephone Encounter (Signed)
Pharmacy states they did not receive  RX for flexeril. Verbally gave RX to pharmacy

## 2012-07-21 NOTE — Telephone Encounter (Signed)
Patient is calling because the Rx for Flexeril didn't make it to her pharmacy.  The Mobic did though.  She needs the Flexeril re-sent.

## 2012-09-21 ENCOUNTER — Telehealth: Payer: Self-pay | Admitting: Family Medicine

## 2012-09-21 NOTE — Telephone Encounter (Signed)
Patient is calling because she has a vein bulging near her temple that pulsates and she would like to speak to the nurse about this.

## 2012-09-21 NOTE — Telephone Encounter (Signed)
Returned call to patient.  Notices vein on left temple pulsating since this morning.  No headache or pain in area.  Denies high blood pressure, blurred vision, numbness, or tingling in face or extremities.  Patient states her eye twitched once today on the same side.  Patient not sure if she should go to ED or urgent care due to her co-pay.  Gave patient phone number for urgent care.  Patient will call back for office visit tomorrow morning if she does not go to ED or urgent care and wants to be evaluated.  Also, discussed with Dr. Earnest Bailey and agrees with advice given to patient.  Gaylene Brooks, RN

## 2012-09-22 ENCOUNTER — Encounter: Payer: Self-pay | Admitting: Family Medicine

## 2012-09-22 ENCOUNTER — Ambulatory Visit (INDEPENDENT_AMBULATORY_CARE_PROVIDER_SITE_OTHER): Payer: Managed Care, Other (non HMO) | Admitting: Family Medicine

## 2012-09-22 VITALS — BP 133/73 | HR 67 | Temp 99.1°F | Ht 63.0 in

## 2012-09-22 DIAGNOSIS — G479 Sleep disorder, unspecified: Secondary | ICD-10-CM

## 2012-09-22 DIAGNOSIS — L989 Disorder of the skin and subcutaneous tissue, unspecified: Secondary | ICD-10-CM

## 2012-09-22 MED ORDER — TRAZODONE HCL 50 MG PO TABS: 50.0000 mg | ORAL_TABLET | Freq: Every evening | ORAL | Status: DC | PRN

## 2012-09-22 NOTE — Progress Notes (Signed)
  Subjective:    Patient ID: Brooke Clarke, female    DOB: [birth year], 52 y.o.   MRN: [id number]  HPI  Patient presents to same day appointment for "knot on forehead."  Located LT temple.  Patient says that yesterday morning, she noticed the vein on her LT temple was enlarged and swollen.  This has never happened before.  She says that swelling has come down, but she can tell the the vein is larger.  She denies any other associated symptoms.  Denies any HA or fever.   Patient also concerned about lack of sleep.  Her husband thinks her symptoms may be due to sleep deprivation and stress.  Patient has not slept well in the last 2 days due to stress.  She has tried Benadryl and Melatonin without much success.  Her husband is requesting a medication to help her sleep at night.  He thinks this could alleviate her stress.  Review of Systems Per HPI    Objective:   Physical Exam  Constitutional: She appears well-nourished. No distress.  Skin:  LT temple: vein on forehead more prominent on LT; but no palpable knot, no swelling or erythema or signs of infection  Psychiatric:  Flat affect, somewhat depressed mood      Assessment & Plan:

## 2012-09-23 ENCOUNTER — Telehealth: Payer: Self-pay | Admitting: Family Medicine

## 2012-09-23 NOTE — Telephone Encounter (Signed)
Needs to speak to nurse about the meds that was given - too strong - knocked her out...     traZODone (DESYREL) 50 MG    temazapam worked for her a few years ago and wants to know if she can get this instead.

## 2012-09-24 ENCOUNTER — Encounter: Payer: Self-pay | Admitting: Family Medicine

## 2012-09-24 DIAGNOSIS — L989 Disorder of the skin and subcutaneous tissue, unspecified: Secondary | ICD-10-CM | POA: Insufficient documentation

## 2012-09-24 NOTE — Assessment & Plan Note (Signed)
Patient and husband appear very stressed out.  Husband thinks her symptoms may be due to sleep deprivation and stress.  She has tried Benadryl and Melatonin without much success.  Will prescribe Trazodone 50 QHS PRN.  Follow up with PCP in 2-4 weeks if no clinical improvement.

## 2012-09-24 NOTE — Assessment & Plan Note (Signed)
I could not appreciate any palpable mass on LT forehead.  Veins on LT did appear more prominent.  Discussed monitoring and observation for now and to return to clinic if swelling or knot become worse.  Will treat sleep deprivation.  Follow up with PCP as needed.

## 2012-09-28 NOTE — Telephone Encounter (Signed)
Will fwd. To Dr. Tye Savoy for review. Lorenda Hatchet, Renato Battles

## 2012-09-28 NOTE — Telephone Encounter (Signed)
Called pt. Informed pt of Dr. Tye Savoy advise. She agreed. Lorenda Hatchet, Renato Battles

## 2012-09-28 NOTE — Telephone Encounter (Signed)
If possible, patient can divide the tablet in half and take 25 mg only.  If she would like a new medication like Temazapam, which is a controlled medication, she will need to see PCP.

## 2013-08-25 ENCOUNTER — Other Ambulatory Visit (HOSPITAL_COMMUNITY)
Admission: RE | Admit: 2013-08-25 | Discharge: 2013-08-25 | Disposition: A | Discharge status: Home / Self Care | Payer: 59 | Source: Ambulatory Visit | Attending: Family Medicine | Admitting: Family Medicine

## 2013-08-25 ENCOUNTER — Other Ambulatory Visit: Payer: Self-pay | Admitting: Family Medicine

## 2013-08-25 DIAGNOSIS — Z124 Encounter for screening for malignant neoplasm of cervix: Secondary | ICD-10-CM | POA: Insufficient documentation

## 2015-01-18 ENCOUNTER — Ambulatory Visit: Payer: BLUE CROSS/BLUE SHIELD | Admitting: Podiatry

## 2016-02-21 ENCOUNTER — Encounter: Payer: Self-pay | Admitting: Dietician

## 2016-02-21 ENCOUNTER — Encounter: Payer: BLUE CROSS/BLUE SHIELD | Attending: Internal Medicine | Admitting: Dietician

## 2016-02-21 DIAGNOSIS — R635 Abnormal weight gain: Secondary | ICD-10-CM | POA: Diagnosis present

## 2016-02-21 DIAGNOSIS — Z713 Dietary counseling and surveillance: Secondary | ICD-10-CM | POA: Diagnosis not present

## 2016-02-21 NOTE — Progress Notes (Signed)
  Medical Nutrition Therapy:  Appt start time: 0810 end time:  0905.   Assessment:  Primary concerns today: Brooke Clarke is here today since she has a "weight problem". Started taking Belviq about a month ago. Did not want to get weight today. Feels like lost weight and then regained a few lbs in the past month after she went to the beach. Was told Belviq may take a few months to work, though has noticed that she is feeling more full. Feels like she needs to work on preparing meals.   Got married 5 years ago and husband can "eat whatever he wants without gaining weight". She has gained about 50 lbs since she got married. Has been gaining and losing weight throughout her adult life. Is tired of yo yo dieting. Lives just with husband. Used to run for exercise and had surgery since she developed cysts in her feet (5-10 years ago). Still has pain in feet and knees so can't be as active as she used to be.  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 Husband also works 10 hours a day. Never eats lunch. Doesn't feel like she has time to cook. Eats out for dinner everyday and more meals on the weekends. "Loves" sweets but doesn't keep them in her house. Does not eat fried foods.   When she lost weight in the past she used to exercise and would eat better. Has prediabetes and cholesterol is high.   Preferred Learning Style:   No preference indicated   Learning Readiness:   Ready   MEDICATIONS: see list   DIETARY INTAKE:  Usual eating pattern includes 2 meals and 2 snacks per day.  Avoided foods include: fish    24-hr recall:  B ( AM): Protein bar or protein shake (powder) or smoothie (berries, flax, almond milk, protein shake) or banana Snk ( AM): animal crackers  L ( PM): none Snk ( PM): none D ( PM): pizza and salad, grilled shrimp, baked potato and salad, a lot of diners and [employer]  Snk ( PM): sometimes light popcorn Beverages: Water, coffee with sugar free flavored creamer  Usual physical  activity: elliptical and bike (not regularly)  Estimated energy needs: 1600 calories 180 g carbohydrates 120 g protein 44 g fat  Progress Towards Goal(s):  In progress.   Nutritional Diagnosis:  [place]-3.3 Overweight/obesity As related to hx of meal skipping, frequent restaurant meals, and inadequate physical activity.  As evidenced by diet recall and patient report of weight gain.    Intervention:  Nutrition counseling provided. Plan Eat 3 meals each day (don't skip lunch). Order meals from [employer]. Look at or think about what you want to order for dinner ahead of time.  Try to prepare foods ahead of time - check out [email or web address].com for recipes. Try to grilling meats and vegetables.    Teaching Method Utilized:  Visual Auditory Hands on  Handouts given during visit include:  none  Barriers to learning/adherence to lifestyle change: busy schedule, pain in feet and knees, husband likes to eat meals out  Demonstrated degree of understanding via:  Teach Back   Monitoring/Evaluation:  Dietary intake, exercise, and body weight in 1 month(s).

## 2016-02-21 NOTE — Patient Instructions (Signed)
Eat 3 meals each day (don't skip lunch). Order meals from [employer]. Look at or think about what you want to order for dinner ahead of time.  Try to prepare foods ahead of time - check out [email or web address].com for recipes. Try to grilling meats and vegetables.

## 2016-03-20 ENCOUNTER — Ambulatory Visit: Payer: Managed Care, Other (non HMO) | Admitting: Dietician

## 2016-06-19 ENCOUNTER — Other Ambulatory Visit: Payer: Self-pay | Admitting: Internal Medicine

## 2016-06-19 DIAGNOSIS — Z1231 Encounter for screening mammogram for malignant neoplasm of breast: Secondary | ICD-10-CM

## 2016-07-23 DIAGNOSIS — M79671 Pain in right foot: Secondary | ICD-10-CM

## 2016-07-23 DIAGNOSIS — M79672 Pain in left foot: Secondary | ICD-10-CM

## 2016-07-23 DIAGNOSIS — G8929 Other chronic pain: Secondary | ICD-10-CM | POA: Insufficient documentation

## 2016-07-24 LAB — HM COLONOSCOPY

## 2016-07-25 ENCOUNTER — Ambulatory Visit
Admission: RE | Admit: 2016-07-25 | Discharge: 2016-07-25 | Disposition: A | Discharge status: Home / Self Care | Payer: BLUE CROSS/BLUE SHIELD | Source: Ambulatory Visit | Attending: Internal Medicine | Admitting: Internal Medicine

## 2016-07-25 DIAGNOSIS — Z1231 Encounter for screening mammogram for malignant neoplasm of breast: Secondary | ICD-10-CM

## 2016-10-17 LAB — HEMOGLOBIN A1C: Hemoglobin A1C: 5.5

## 2016-10-17 LAB — LIPID PANEL
Cholesterol: 165 (ref 0–200)
HDL: 52 (ref 35–70)
LDL Cholesterol: 99
TRIGLYCERIDES: 72 (ref 40–160)

## 2016-12-24 DIAGNOSIS — H9313 Tinnitus, bilateral: Secondary | ICD-10-CM | POA: Insufficient documentation

## 2017-01-25 DIAGNOSIS — E7800 Pure hypercholesterolemia, unspecified: Secondary | ICD-10-CM | POA: Diagnosis not present

## 2017-01-25 DIAGNOSIS — J309 Allergic rhinitis, unspecified: Secondary | ICD-10-CM | POA: Diagnosis not present

## 2017-01-25 DIAGNOSIS — R635 Abnormal weight gain: Secondary | ICD-10-CM | POA: Diagnosis not present

## 2017-01-25 DIAGNOSIS — F419 Anxiety disorder, unspecified: Secondary | ICD-10-CM | POA: Diagnosis not present

## 2017-02-22 DIAGNOSIS — R635 Abnormal weight gain: Secondary | ICD-10-CM | POA: Diagnosis not present

## 2017-02-22 DIAGNOSIS — M545 Low back pain: Secondary | ICD-10-CM | POA: Diagnosis not present

## 2017-03-29 DIAGNOSIS — L0390 Cellulitis, unspecified: Secondary | ICD-10-CM | POA: Diagnosis not present

## 2017-03-29 DIAGNOSIS — L659 Nonscarring hair loss, unspecified: Secondary | ICD-10-CM | POA: Diagnosis not present

## 2017-03-29 DIAGNOSIS — D539 Nutritional anemia, unspecified: Secondary | ICD-10-CM | POA: Diagnosis not present

## 2017-03-29 DIAGNOSIS — L309 Dermatitis, unspecified: Secondary | ICD-10-CM | POA: Diagnosis not present

## 2017-03-29 DIAGNOSIS — L299 Pruritus, unspecified: Secondary | ICD-10-CM | POA: Diagnosis not present

## 2017-03-29 LAB — HEPATIC FUNCTION PANEL
ALT: 28 (ref 7–35)
AST: 19 (ref 13–35)
Alkaline Phosphatase: 81 (ref 25–125)

## 2017-03-29 LAB — BASIC METABOLIC PANEL
BUN: 18 (ref 4–21)
CREATININE: 1.1 (ref 0.5–1.1)
Glucose: 89
POTASSIUM: 3.9 (ref 3.4–5.3)
Sodium: 141 (ref 137–147)

## 2017-03-29 LAB — CBC AND DIFFERENTIAL
HCT: 46 (ref 36–46)
Hemoglobin: 14.3 (ref 12.0–16.0)
Platelets: 210 (ref 150–399)
WBC: 9.4

## 2017-03-29 LAB — VITAMIN B12: Vitamin B-12: 2000

## 2017-03-29 LAB — IRON,TIBC AND FERRITIN PANEL
IRON: 76
TIBC: 400
UIBC: 324

## 2017-04-01 DIAGNOSIS — Z79899 Other long term (current) drug therapy: Secondary | ICD-10-CM | POA: Diagnosis not present

## 2017-04-01 DIAGNOSIS — R635 Abnormal weight gain: Secondary | ICD-10-CM | POA: Diagnosis not present

## 2017-04-01 DIAGNOSIS — R450 Nervousness: Secondary | ICD-10-CM | POA: Diagnosis not present

## 2017-04-01 DIAGNOSIS — F419 Anxiety disorder, unspecified: Secondary | ICD-10-CM | POA: Diagnosis not present

## 2017-04-10 DIAGNOSIS — R945 Abnormal results of liver function studies: Secondary | ICD-10-CM | POA: Diagnosis not present

## 2017-04-19 DIAGNOSIS — F419 Anxiety disorder, unspecified: Secondary | ICD-10-CM | POA: Diagnosis not present

## 2017-04-19 DIAGNOSIS — R635 Abnormal weight gain: Secondary | ICD-10-CM | POA: Diagnosis not present

## 2017-04-19 DIAGNOSIS — M545 Low back pain: Secondary | ICD-10-CM | POA: Diagnosis not present

## 2017-04-19 DIAGNOSIS — G4763 Sleep related bruxism: Secondary | ICD-10-CM | POA: Diagnosis not present

## 2017-05-12 DIAGNOSIS — R635 Abnormal weight gain: Secondary | ICD-10-CM | POA: Diagnosis not present

## 2017-05-12 DIAGNOSIS — F419 Anxiety disorder, unspecified: Secondary | ICD-10-CM | POA: Diagnosis not present

## 2017-05-12 DIAGNOSIS — Z23 Encounter for immunization: Secondary | ICD-10-CM | POA: Diagnosis not present

## 2017-05-12 DIAGNOSIS — G4763 Sleep related bruxism: Secondary | ICD-10-CM | POA: Diagnosis not present

## 2017-05-12 DIAGNOSIS — M545 Low back pain: Secondary | ICD-10-CM | POA: Diagnosis not present

## 2017-06-17 DIAGNOSIS — Z79899 Other long term (current) drug therapy: Secondary | ICD-10-CM | POA: Diagnosis not present

## 2017-06-17 DIAGNOSIS — G4763 Sleep related bruxism: Secondary | ICD-10-CM | POA: Diagnosis not present

## 2017-06-17 DIAGNOSIS — M545 Low back pain: Secondary | ICD-10-CM | POA: Diagnosis not present

## 2017-06-17 DIAGNOSIS — R635 Abnormal weight gain: Secondary | ICD-10-CM | POA: Diagnosis not present

## 2017-06-18 DIAGNOSIS — M9902 Segmental and somatic dysfunction of thoracic region: Secondary | ICD-10-CM | POA: Diagnosis not present

## 2017-06-18 DIAGNOSIS — M9903 Segmental and somatic dysfunction of lumbar region: Secondary | ICD-10-CM | POA: Diagnosis not present

## 2017-06-18 DIAGNOSIS — M9901 Segmental and somatic dysfunction of cervical region: Secondary | ICD-10-CM | POA: Diagnosis not present

## 2017-06-18 DIAGNOSIS — M791 Myalgia, unspecified site: Secondary | ICD-10-CM | POA: Diagnosis not present

## 2017-06-21 DIAGNOSIS — M9903 Segmental and somatic dysfunction of lumbar region: Secondary | ICD-10-CM | POA: Diagnosis not present

## 2017-06-21 DIAGNOSIS — M791 Myalgia, unspecified site: Secondary | ICD-10-CM | POA: Diagnosis not present

## 2017-06-21 DIAGNOSIS — M9902 Segmental and somatic dysfunction of thoracic region: Secondary | ICD-10-CM | POA: Diagnosis not present

## 2017-06-21 DIAGNOSIS — M9901 Segmental and somatic dysfunction of cervical region: Secondary | ICD-10-CM | POA: Diagnosis not present

## 2017-06-24 DIAGNOSIS — M791 Myalgia, unspecified site: Secondary | ICD-10-CM | POA: Diagnosis not present

## 2017-06-24 DIAGNOSIS — M9902 Segmental and somatic dysfunction of thoracic region: Secondary | ICD-10-CM | POA: Diagnosis not present

## 2017-06-24 DIAGNOSIS — Z79899 Other long term (current) drug therapy: Secondary | ICD-10-CM | POA: Diagnosis not present

## 2017-06-24 DIAGNOSIS — R0683 Snoring: Secondary | ICD-10-CM | POA: Diagnosis not present

## 2017-06-24 DIAGNOSIS — M9903 Segmental and somatic dysfunction of lumbar region: Secondary | ICD-10-CM | POA: Diagnosis not present

## 2017-06-24 DIAGNOSIS — G4733 Obstructive sleep apnea (adult) (pediatric): Secondary | ICD-10-CM | POA: Diagnosis not present

## 2017-06-24 DIAGNOSIS — M9901 Segmental and somatic dysfunction of cervical region: Secondary | ICD-10-CM | POA: Diagnosis not present

## 2017-06-25 DIAGNOSIS — M79671 Pain in right foot: Secondary | ICD-10-CM | POA: Diagnosis not present

## 2017-06-25 DIAGNOSIS — M79672 Pain in left foot: Secondary | ICD-10-CM | POA: Diagnosis not present

## 2017-06-26 DIAGNOSIS — M9901 Segmental and somatic dysfunction of cervical region: Secondary | ICD-10-CM | POA: Diagnosis not present

## 2017-06-26 DIAGNOSIS — M9902 Segmental and somatic dysfunction of thoracic region: Secondary | ICD-10-CM | POA: Diagnosis not present

## 2017-06-26 DIAGNOSIS — M9903 Segmental and somatic dysfunction of lumbar region: Secondary | ICD-10-CM | POA: Diagnosis not present

## 2017-06-26 DIAGNOSIS — M791 Myalgia, unspecified site: Secondary | ICD-10-CM | POA: Diagnosis not present

## 2017-07-07 DIAGNOSIS — M9902 Segmental and somatic dysfunction of thoracic region: Secondary | ICD-10-CM | POA: Diagnosis not present

## 2017-07-07 DIAGNOSIS — M9901 Segmental and somatic dysfunction of cervical region: Secondary | ICD-10-CM | POA: Diagnosis not present

## 2017-07-07 DIAGNOSIS — M9903 Segmental and somatic dysfunction of lumbar region: Secondary | ICD-10-CM | POA: Diagnosis not present

## 2017-07-07 DIAGNOSIS — M791 Myalgia, unspecified site: Secondary | ICD-10-CM | POA: Diagnosis not present

## 2017-07-16 DIAGNOSIS — M9903 Segmental and somatic dysfunction of lumbar region: Secondary | ICD-10-CM | POA: Diagnosis not present

## 2017-07-16 DIAGNOSIS — M9901 Segmental and somatic dysfunction of cervical region: Secondary | ICD-10-CM | POA: Diagnosis not present

## 2017-07-16 DIAGNOSIS — M791 Myalgia, unspecified site: Secondary | ICD-10-CM | POA: Diagnosis not present

## 2017-07-16 DIAGNOSIS — M9902 Segmental and somatic dysfunction of thoracic region: Secondary | ICD-10-CM | POA: Diagnosis not present

## 2017-07-23 DIAGNOSIS — M9903 Segmental and somatic dysfunction of lumbar region: Secondary | ICD-10-CM | POA: Diagnosis not present

## 2017-07-23 DIAGNOSIS — M9902 Segmental and somatic dysfunction of thoracic region: Secondary | ICD-10-CM | POA: Diagnosis not present

## 2017-07-23 DIAGNOSIS — M791 Myalgia, unspecified site: Secondary | ICD-10-CM | POA: Diagnosis not present

## 2017-07-23 DIAGNOSIS — M9901 Segmental and somatic dysfunction of cervical region: Secondary | ICD-10-CM | POA: Diagnosis not present

## 2017-07-30 ENCOUNTER — Ambulatory Visit: Payer: BLUE CROSS/BLUE SHIELD | Admitting: Sports Medicine

## 2017-07-30 DIAGNOSIS — M9901 Segmental and somatic dysfunction of cervical region: Secondary | ICD-10-CM | POA: Diagnosis not present

## 2017-07-30 DIAGNOSIS — M791 Myalgia, unspecified site: Secondary | ICD-10-CM | POA: Diagnosis not present

## 2017-07-30 DIAGNOSIS — M9903 Segmental and somatic dysfunction of lumbar region: Secondary | ICD-10-CM | POA: Diagnosis not present

## 2017-07-30 DIAGNOSIS — M9902 Segmental and somatic dysfunction of thoracic region: Secondary | ICD-10-CM | POA: Diagnosis not present

## 2017-07-31 ENCOUNTER — Ambulatory Visit (INDEPENDENT_AMBULATORY_CARE_PROVIDER_SITE_OTHER): Payer: BLUE CROSS/BLUE SHIELD

## 2017-07-31 ENCOUNTER — Ambulatory Visit: Payer: BLUE CROSS/BLUE SHIELD | Admitting: Sports Medicine

## 2017-07-31 ENCOUNTER — Encounter: Payer: Self-pay | Admitting: Sports Medicine

## 2017-07-31 VITALS — BP 124/84 | HR 66 | Ht 63.0 in | Wt 230.8 lb

## 2017-07-31 DIAGNOSIS — M545 Low back pain, unspecified: Secondary | ICD-10-CM

## 2017-07-31 DIAGNOSIS — M47816 Spondylosis without myelopathy or radiculopathy, lumbar region: Secondary | ICD-10-CM | POA: Diagnosis not present

## 2017-07-31 DIAGNOSIS — M25552 Pain in left hip: Secondary | ICD-10-CM | POA: Diagnosis not present

## 2017-07-31 DIAGNOSIS — G8929 Other chronic pain: Secondary | ICD-10-CM

## 2017-07-31 MED ORDER — IBUPROFEN-FAMOTIDINE 800-26.6 MG PO TABS: 1.0000 | ORAL_TABLET | Freq: Three times a day (TID) | ORAL | 2 refills | Status: DC | PRN

## 2017-07-31 MED ORDER — IBUPROFEN-FAMOTIDINE 800-26.6 MG PO TABS: 1.0000 | ORAL_TABLET | Freq: Three times a day (TID) | ORAL | 0 refills | Status: DC | PRN

## 2017-07-31 NOTE — Patient Instructions (Signed)
Please perform the exercise program that we have prepared for you and gone over in detail on a daily basis.  In addition to the handout you were provided you can access your program through: [email or web address]   Your unique program code is: [email or web address]    Also check out [employer]" which is a program developed by Dr. Minerva Ends.   There are links to a couple of his YouTube Videos below and I would like to see performing one of his videos 5-6 days per week.    A good intro video is: "Independence from Pain 7-minute Video" - [email or web address]   His more advanced video is: "Powerful Posture and Pain Relief: 12 minutes of Foundation Training" - https://youtu.be/[email or web address]  Do not try to attempt this entire video when first beginning.    Try breaking of each exercise that he goes into shorter segments.  Otherwise if they perform an exercise for 45 seconds, start with 15 seconds and rest and then resume when they begin the new activity.    If you work your way up to doing this 12 minute video, I expect you will see significant improvements in your pain.  If you enjoy his videos and would like to find out more you can look on his website: [email or web address]  He has a workout streaming option as well as a DVD set available for purchase.  Amazon has the best price for his DVDs.

## 2017-07-31 NOTE — Progress Notes (Signed)
Brooke Clarke. Addam Goeller, [place] [facility] - 56 y.o. female MRN [id number]  Date of birth: 1960/08/15  Visit Date: 07/31/2017  PCP: Annetta Maw, MD   Referred by: No ref. provider found   Scribe for today's visit: Josepha Pigg, CMA    SUBJECTIVE:  Brooke Clarke is here for New Patient (Initial Visit) (low back pain)  Her low back pain symptoms INITIALLY: Began over the summer and there was no know MOI Described as moderate pulling sensation that is constant, radiating to LT hip.  Worsened with bending.  Improved with rest Additional associated symptoms include: She denies pain in the gluteal region, groin, or legs.     At this time symptoms are worsening compared to onset, pain is more frequent. She has tried seeing chiropractor, dry needling, steroid injections. She has been taking Cyclobenzaprine with little relief. She has also tried taking Ibuprofen with little relief. She has tried applying heat and ice with little relief.    ROS Denies night time disturbances. Denies fevers, chills, or night sweats. Denies unexplained weight loss. Denies personal history of cancer. Denies changes in bowel or bladder habits. Denies recent unreported falls. Denies new or worsening dyspnea or wheezing. Denies headaches or dizziness.  Denies numbness, tingling or weakness  In the extremities.  Denies dizziness or presyncopal episodes Denies lower extremity edema     HISTORY & PERTINENT PRIOR DATA:  Prior History reviewed and updated per electronic medical record.  Significant history, findings, studies and interim changes include:  reports that  has never smoked. she has never used smokeless tobacco. No results for input(s): HGBA1C, LABURIC, CREATINE in the last 8760 hours. No specialty comments available. Problem  Spondylosis of Lumbar Joint   Degenerative changes most notable at L4-5 and L5-S1       OBJECTIVE:  VS:  HT:5\' 3"  (160 cm)   WT:230 lb 12.8 oz (104.7 kg)  BMI:40.89    BP:124/84  HR:66bpm  TEMP: ( )  RESP:95 %  PHYSICAL EXAM: Constitutional: WDWN, Non-toxic appearing. Psychiatric: Alert & appropriately interactive. Not depressed or anxious appearing. Respiratory: No increased work of breathing. Trachea Midline Eyes: Pupils are equal. EOM intact without nystagmus. No scleral icterus Cardiovascular:  Peripheral Pulses: peripheral pulses symmetrical No clubbing or cyanosis appreciated Capillary Refill is normal, less than 2 seconds No signficant generalized edema/anasarca Sensory Exam: intact to light touch  Back & Lower Extremities:  Bilateral negative straight leg raise.  Tightness however bilaterally that is nonradiating  No significant midline tenderness.    Moderate tenderness over the L4 and L5 facets left greater than right  Good internal and external rotation of the hips.  Patient is able to heel and toe walk without significant difficulty.  Manual muscle testing is 5+/5 in BLE myotomes without focality.  T FL recruitment pattern with hip abduction with 4 out of 5 weakness with glute medius isolation.  Lower extremity DTRs 2+/4 diffusely and symmetric   ASSESSMENT & PLAN:   1. Chronic bilateral low back pain without sciatica   2. Left hip pain   3. Spondylosis of lumbar region without myelopathy or radiculopathy    PLAN:    Spondylosis of lumbar joint Core stabilization exercises including hip abduction and Goodman exercises reviewed today per procedure note.  Can consider facet injections if any lack of improvement may be a candidate for RFA if persistent ongoing symptoms.   ++++++++++++++++++++++++++++++++++++++++++++ Orders & Meds: Orders  Placed This Encounter  Procedures  . Misc procedure  . DG Lumbar Spine 2-3 Views  . DG HIP UNILAT W OR W/O PELVIS 2-3 VIEWS LEFT    Meds ordered this encounter  Medications  . Ibuprofen-Famotidine  (DUEXIS) 800-26.6 MG TABS    Sig: Take 1 tablet by mouth 3 (three) times daily as needed. 1 tab po tid X 14 days then 1 tab po tid as needed    Dispense:  90 tablet    Refill:  2    Home Phone      [phone] Mobile          [phone]   . Ibuprofen-Famotidine (DUEXIS) 800-26.6 MG TABS    Sig: Take 1 tablet by mouth 3 (three) times daily as needed.    Dispense:  9 tablet    Refill:  0    ++++++++++++++++++++++++++++++++++++++++++++ Follow-up: Return in about 4 weeks (around 08/28/2017).   Pertinent documentation may be included in additional procedure notes, imaging studies, problem based documentation and patient instructions. Please see these sections of the encounter for additional information regarding this visit. CMA/ATC served as Education administrator during this visit. History, Physical, and Plan performed by medical provider. Documentation and orders reviewed and attested to.      Gerda Diss, [facility] Physician

## 2017-07-31 NOTE — Procedures (Signed)
PROCEDURE NOTE: THERAPEUTIC EXERCISES (97110) 15 minutes spent for Therapeutic exercises as below and as referenced in the AVS. This included exercises focusing on stretching, strengthening, with significant focus on eccentric aspects.  Proper technique shown and discussed handout in great detail with ATC. All questions were discussed and answered.   Long term goals include an improvement in range of motion, strength, endurance as well as avoiding reinjury. Frequency of visits is one time as determined during today's  office visit. Frequency of exercises to be performed is as per handout.  EXERCISES REVIEWED:  Goodman exercises  Side-lying hip abduction  Cat and camel

## 2017-07-31 NOTE — Assessment & Plan Note (Signed)
Core stabilization exercises including hip abduction and Goodman exercises reviewed today per procedure note.  Can consider facet injections if any lack of improvement may be a candidate for RFA if persistent ongoing symptoms.

## 2017-08-11 ENCOUNTER — Encounter: Payer: Self-pay | Admitting: Sports Medicine

## 2017-08-19 DIAGNOSIS — J329 Chronic sinusitis, unspecified: Secondary | ICD-10-CM | POA: Diagnosis not present

## 2017-08-19 DIAGNOSIS — R6889 Other general symptoms and signs: Secondary | ICD-10-CM | POA: Diagnosis not present

## 2017-08-19 DIAGNOSIS — R05 Cough: Secondary | ICD-10-CM | POA: Diagnosis not present

## 2017-08-19 DIAGNOSIS — B9689 Other specified bacterial agents as the cause of diseases classified elsewhere: Secondary | ICD-10-CM | POA: Diagnosis not present

## 2017-08-28 ENCOUNTER — Ambulatory Visit: Payer: BLUE CROSS/BLUE SHIELD | Admitting: Sports Medicine

## 2017-09-03 ENCOUNTER — Ambulatory Visit: Payer: BLUE CROSS/BLUE SHIELD | Admitting: Sports Medicine

## 2017-10-28 ENCOUNTER — Ambulatory Visit: Payer: Self-pay

## 2017-10-28 ENCOUNTER — Encounter: Payer: Self-pay | Admitting: Sports Medicine

## 2017-10-28 ENCOUNTER — Ambulatory Visit: Payer: BLUE CROSS/BLUE SHIELD | Admitting: Sports Medicine

## 2017-10-28 ENCOUNTER — Ambulatory Visit (INDEPENDENT_AMBULATORY_CARE_PROVIDER_SITE_OTHER): Payer: BLUE CROSS/BLUE SHIELD

## 2017-10-28 VITALS — BP 136/88 | HR 72 | Ht 63.0 in | Wt 241.8 lb

## 2017-10-28 DIAGNOSIS — M25561 Pain in right knee: Secondary | ICD-10-CM

## 2017-10-28 DIAGNOSIS — M1711 Unilateral primary osteoarthritis, right knee: Secondary | ICD-10-CM | POA: Insufficient documentation

## 2017-10-28 DIAGNOSIS — M1990 Unspecified osteoarthritis, unspecified site: Secondary | ICD-10-CM | POA: Insufficient documentation

## 2017-10-28 NOTE — Procedures (Signed)
PROCEDURE NOTE:  Ultrasound Guided: Aspiration and Injection: right knee Images were obtained and interpreted by myself, Teresa Coombs, DO  Images have been saved and stored to PACS system. Images obtained on: GE S7 Ultrasound machine   DESCRIPTION OF PROCEDURE:  The patient's clinical condition is marked by substantial pain and/or significant functional disability. Other conservative therapy has not provided relief, is contraindicated, or not appropriate. There is a reasonable likelihood that injection will significantly improve the patient's pain and/or functional impairment.  After discussing the risks, benefits and expected outcomes of the injection and all questions were reviewed and answered, the patient wished to undergo the above named procedure. Verbal consent was obtained.  The ultrasound was used to identify the target structure and adjacent neurovascular structures. The skin was then prepped in sterile fashion and the target structure was injected under direct visualization using sterile technique as below:  PREP: Alcohol, Ethel Chloride, 5cc 1% lidocaine on  1.5 in. Needle APPROACH: direct, stopcock technique, 25g 1.5in.  INJECTATE: 2cc: 0.5% marcaine, 2cc: 40mg /mL DepoMedrol   ASPIRATE: None   DRESSING: Band-Aid   Post procedural instructions including recommending icing and warning signs for infection were reviewed.   This procedure was well tolerated and there were no complications.   IMPRESSION: Succesful Ultrasound Guided: Injection

## 2017-10-28 NOTE — Patient Instructions (Signed)

## 2017-10-28 NOTE — Assessment & Plan Note (Signed)
Aspiration & INjectioon 10/28/17

## 2017-10-28 NOTE — Progress Notes (Signed)
  Brooke Clarke - 57 y.o. female MRN [id number]  Date of birth: 1960/10/14  Scribe for today's visit: Wendy Poet, LAT, ATC     SUBJECTIVE:  Brooke Clarke is here for Initial Assessment (R knee pain and swelling) .   Initial visit for R knee on 10/28/17: Her R knee pain and swelling symptoms INITIALLY: Began on Sunday after moving her desk at work after they replaced the floors.  She states that she twisted her R knee in the process. Described as 5/10 pain and feels like she has fluid on her knee.  She states that she feels like her knee wants to give out on her as well. Pain is nonradiating but does feel that her R calf is tight. Worsened with weight bearing Improved with nothing stated Additional associated symptoms include: mechanical symptoms noted w/ a lack of stability noted; no N/T noted in the R LE    At this time symptoms show no change compared to onset  She has been taking Duexis.  She has not iced her knee and has not used any compression.   ROS Denies night time disturbances. Denies fevers, chills, or night sweats. Denies unexplained weight loss. Denies personal history of cancer. Denies changes in bowel or bladder habits. Denies recent unreported falls. Denies new or worsening dyspnea or wheezing. Denies headaches or dizziness.  Denies numbness, tingling or weakness  In the extremities.  Denies dizziness or presyncopal episodes Reports lower extremity edema - in the R knee   HISTORY & PERTINENT PRIOR DATA:  Prior History reviewed and updated per electronic medical record.  Significant/pertinent history, findings, studies include:  reports that she has never smoked. She has never used smokeless tobacco. No results for input(s): HGBA1C, LABURIC, CREATINE in the last 8760 hours. 11/24/17: [place] - no PA required. Pt responsibility will be 20% ONCE deductible has been met. -BSC Problem  Osteoarthritis of Right Knee    OBJECTIVE:  VS:  HT:_0  (160 cm)   WT:241 lb  12.8 oz (109.7 kg)  BMI:42.84    BP:136/88  HR:72bpm  TEMP: ( )  RESP:95 %   PHYSICAL EXAM: Constitutional: WDWN, Non-toxic appearing. Psychiatric: Alert & appropriately interactive.  Not depressed or anxious appearing. Respiratory: No increased work of breathing.  Trachea Midline Eyes: Pupils are equal.  EOM intact without nystagmus.  No scleral icterus  Vascular Exam: warm to touch no edema  lower extremity neuro exam: unremarkable  MSK Exam: She has pain with palpation of the medial and lateral joint lines of the knee.  Small effusion.  Ligamentously stable.   ASSESSMENT & PLAN:   1. Acute pain of right knee   2. Primary osteoarthritis of right knee     PLAN: Ultrasound-guided injection performed today per procedure note.  X-rays obtained today as well.  We will plan for further diagnostic evaluation with MRI if any lack of improvement.  Follow-up: Return in about 4 weeks (around 11/25/2017).      Please see additional documentation for Objective, Assessment and Plan sections. Pertinent additional documentation may be included in corresponding procedure notes, imaging studies, problem based documentation and patient instructions. Please see these sections of the encounter for additional information regarding this visit.  CMA/ATC served as Education administrator during this visit. History, Physical, and Plan performed by medical provider. Documentation and orders reviewed and attested to.      Gerda Diss, [place] [facility] Physician

## 2017-11-19 ENCOUNTER — Ambulatory Visit: Payer: BLUE CROSS/BLUE SHIELD | Admitting: Sports Medicine

## 2017-11-19 ENCOUNTER — Encounter: Payer: Self-pay | Admitting: Sports Medicine

## 2017-11-19 VITALS — BP 122/72 | HR 82 | Ht 63.0 in | Wt 242.0 lb

## 2017-11-19 DIAGNOSIS — M1711 Unilateral primary osteoarthritis, right knee: Secondary | ICD-10-CM

## 2017-11-19 DIAGNOSIS — M25561 Pain in right knee: Secondary | ICD-10-CM | POA: Diagnosis not present

## 2017-11-19 NOTE — Progress Notes (Signed)
Brooke Clarke. Brooke Clarke, [name] - 57 y.o. female MRN [id number]  Date of birth: 14-Jul-1961  Visit Date: 11/19/2017  PCP: Annetta Maw, MD   Referred by: Annetta Maw, MD  Scribe for today's visit: Wendy Poet, LAT, ATC     SUBJECTIVE:  Brooke Clarke is here for Follow-up (R knee pain) .   Initial visit for R knee on 10/28/17: Her R knee pain and swelling symptoms INITIALLY: Began on Sunday after moving her desk at work after they replaced the floors.  She states that she twisted her R knee in the process. Described as 5/10 pain and feels like she has fluid on her knee.  She states that she feels like her knee wants to give out on her as well. Pain is nonradiating but does feel that her R calf is tight. Worsened with weight bearing Improved with nothing stated Additional associated symptoms include: mechanical symptoms noted w/ a lack of stability noted; no N/T noted in the R LE    At this time symptoms show no change compared to onset  She has been taking Duexis.  She has not iced her knee and has not used any compression.  11/19/17: Compared to the last office visit on 10/28/17, her previously described R knee pain symptoms are improving until Sunday when her R knee locked up.  She states that her knee will feel like it's dislocating and also feels like the tendons at the back of her knee are very tight. Current symptoms are mild & are nonradiating She has been taking Duexis.  She had a R knee XR on 10/28/17 and had a knee injection that day as well.   ROS Denies night time disturbances. Denies fevers, chills, or night sweats. Denies unexplained weight loss. Denies personal history of cancer. Denies changes in bowel or bladder habits. Denies recent unreported falls. Denies new or worsening dyspnea or wheezing. Denies headaches or dizziness.  Denies numbness, tingling or weakness  In the  extremities.  Denies dizziness or presyncopal episodes Reports lower extremity edema    HISTORY & PERTINENT PRIOR DATA:  Prior History reviewed and updated per electronic medical record.  Significant/pertinent history, findings, studies include:  reports that she has never smoked. She has never used smokeless tobacco. No results for input(s): HGBA1C, LABURIC, CREATINE in the last 8760 hours. 11/24/17: [place] - no PA required. Pt responsibility will be 20% ONCE deductible has been met. -BSC No problems updated.  OBJECTIVE:  VS:  HT:_0  (160 cm)   WT:242 lb (109.8 kg)  BMI:42.88    BP:122/72  HR:82bpm  TEMP: ( )  RESP:94 %   PHYSICAL EXAM: Constitutional: WDWN, Non-toxic appearing. Psychiatric: Alert & appropriately interactive.  Not depressed or anxious appearing. Respiratory: No increased work of breathing.  Trachea Midline Eyes: Pupils are equal.  EOM intact without nystagmus.  No scleral icterus  Vascular Exam: warm to touch no edema  lower extremity neuro exam: unremarkable  MSK Exam: Generalized TTP over the right knee.  There is minimal effusion.  Small amount of pain of medial and lateral joint lines.  Mild pain with McMurray's.   ASSESSMENT & PLAN:   1. Primary osteoarthritis of right knee   2. Acute pain of right knee     PLAN: She is actually quite well with over-the-counter options in combination with the injection.  Symptoms are mild and tolerable at this point..  We  will plan to have her continue with this at this time he can consider Visco supplementation versus corticosteroid injections at follow-up.   Follow-up: Return in about 2 months (around 01/28/2018).      Please see additional documentation for Objective, Assessment and Plan sections. Pertinent additional documentation may be included in corresponding procedure notes, imaging studies, problem based documentation and patient instructions. Please see these sections of the encounter for  additional information regarding this visit.  CMA/ATC served as Education administrator during this visit. History, Physical, and Plan performed by medical provider. Documentation and orders reviewed and attested to.      Gerda Diss, [place] [place] [facility] Physician

## 2017-12-18 ENCOUNTER — Encounter: Payer: Self-pay | Admitting: Sports Medicine

## 2017-12-18 NOTE — Procedures (Signed)
X-Rays obtained at [facility] Interpreted by myself Gerda Diss, DO) during office visit.  Results were reviewed with the patient at the time of the visit.   3 VIEW X-RAY of: Right knee  FINDINGS:  Tricompartmental degenerative changes most notably over the medial compartment. Marked abnormalities with osteophytic spurring.    Impression: Moderate to severe patellofemoral and medial compartment arthritis of the right knee.

## 2018-01-21 ENCOUNTER — Ambulatory Visit: Payer: BLUE CROSS/BLUE SHIELD | Admitting: Sports Medicine

## 2018-01-25 ENCOUNTER — Encounter: Payer: Self-pay | Admitting: Family Medicine

## 2018-01-26 DIAGNOSIS — Z131 Encounter for screening for diabetes mellitus: Secondary | ICD-10-CM | POA: Diagnosis not present

## 2018-01-26 DIAGNOSIS — D539 Nutritional anemia, unspecified: Secondary | ICD-10-CM | POA: Diagnosis not present

## 2018-01-26 DIAGNOSIS — Z1331 Encounter for screening for depression: Secondary | ICD-10-CM | POA: Diagnosis not present

## 2018-01-26 DIAGNOSIS — Z0000 Encounter for general adult medical examination without abnormal findings: Secondary | ICD-10-CM | POA: Diagnosis not present

## 2018-01-26 DIAGNOSIS — R0602 Shortness of breath: Secondary | ICD-10-CM | POA: Diagnosis not present

## 2018-01-26 DIAGNOSIS — E559 Vitamin D deficiency, unspecified: Secondary | ICD-10-CM | POA: Diagnosis not present

## 2018-01-26 DIAGNOSIS — Z114 Encounter for screening for human immunodeficiency virus [HIV]: Secondary | ICD-10-CM | POA: Diagnosis not present

## 2018-01-26 DIAGNOSIS — R5383 Other fatigue: Secondary | ICD-10-CM | POA: Diagnosis not present

## 2018-02-02 ENCOUNTER — Encounter: Payer: Self-pay | Admitting: Physical Therapy

## 2018-03-04 ENCOUNTER — Encounter: Payer: Self-pay | Admitting: Family Medicine

## 2018-03-04 ENCOUNTER — Encounter

## 2018-03-04 ENCOUNTER — Ambulatory Visit: Payer: BLUE CROSS/BLUE SHIELD | Admitting: Family Medicine

## 2018-03-04 VITALS — BP 130/78 | HR 63 | Temp 98.6°F | Ht 63.0 in | Wt 240.4 lb

## 2018-03-04 DIAGNOSIS — R739 Hyperglycemia, unspecified: Secondary | ICD-10-CM

## 2018-03-04 DIAGNOSIS — A6000 Herpesviral infection of urogenital system, unspecified: Secondary | ICD-10-CM | POA: Diagnosis not present

## 2018-03-04 DIAGNOSIS — R5383 Other fatigue: Secondary | ICD-10-CM | POA: Diagnosis not present

## 2018-03-04 LAB — CBC WITH DIFFERENTIAL/PLATELET
Basophils Absolute: 0 10*3/uL (ref 0.0–0.1)
Basophils Relative: 0.6 % (ref 0.0–3.0)
Eosinophils Absolute: 0.2 10*3/uL (ref 0.0–0.7)
Eosinophils Relative: 2.6 % (ref 0.0–5.0)
HCT: 41.5 % (ref 36.0–46.0)
Hemoglobin: 14.1 g/dL (ref 12.0–15.0)
Lymphocytes Relative: 30.6 % (ref 12.0–46.0)
Lymphs Abs: 2 10*3/uL (ref 0.7–4.0)
MCHC: 34 g/dL (ref 30.0–36.0)
MCV: 89.5 fl (ref 78.0–100.0)
Monocytes Absolute: 0.4 10*3/uL (ref 0.1–1.0)
Monocytes Relative: 5.5 % (ref 3.0–12.0)
Neutro Abs: 4 10*3/uL (ref 1.4–7.7)
Neutrophils Relative %: 60.7 % (ref 43.0–77.0)
Platelets: 194 10*3/uL (ref 150.0–400.0)
RBC: 4.64 Mil/uL (ref 3.87–5.11)
RDW: 14 % (ref 11.5–15.5)
WBC: 6.6 10*3/uL (ref 4.0–10.5)

## 2018-03-04 LAB — COMPREHENSIVE METABOLIC PANEL
ALT: 27 U/L (ref 0–35)
AST: 15 U/L (ref 0–37)
Albumin: 4.3 g/dL (ref 3.5–5.2)
Alkaline Phosphatase: 83 U/L (ref 39–117)
BUN: 15 mg/dL (ref 6–23)
CO2: 27 mEq/L (ref 19–32)
Calcium: 9.5 mg/dL (ref 8.4–10.5)
Chloride: 106 mEq/L (ref 96–112)
Creatinine, Ser: 0.7 mg/dL (ref 0.40–1.20)
GFR: 91.02 mL/min (ref >60.00)
Glucose, Bld: 110 mg/dL — ABNORMAL HIGH (ref 70–99)
Potassium: 4.2 mEq/L (ref 3.5–5.1)
Sodium: 140 mEq/L (ref 135–145)
Total Bilirubin: 0.3 mg/dL (ref 0.2–1.2)
Total Protein: 6.8 g/dL (ref 6.0–8.3)

## 2018-03-04 LAB — HEMOGLOBIN A1C: Hgb A1c MFr Bld: 5.9 % (ref 4.6–6.5)

## 2018-03-04 LAB — VITAMIN D 25 HYDROXY (VIT D DEFICIENCY, FRACTURES): VITD: 26.55 ng/mL — ABNORMAL LOW (ref 30.00–100.00)

## 2018-03-04 LAB — TSH: TSH: 2.03 u[IU]/mL (ref 0.35–4.50)

## 2018-03-04 LAB — VITAMIN B12: Vitamin B-12: 761 pg/mL (ref 211–911)

## 2018-03-04 MED ORDER — VALACYCLOVIR HCL 500 MG PO TABS
500.0000 mg | ORAL_TABLET | Freq: Every day | ORAL | 2 refills | Status: DC
Start: 2018-03-04 — End: 2018-09-02
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
## 2018-03-07 ENCOUNTER — Encounter: Payer: Self-pay | Admitting: Family Medicine

## 2018-03-08 ENCOUNTER — Encounter: Payer: Self-pay | Admitting: Family Medicine

## 2018-03-08 MED ORDER — METFORMIN HCL ER 750 MG PO TB24: 750.0000 mg | ORAL_TABLET | Freq: Every day | ORAL | 3 refills | Status: DC

## 2018-03-08 NOTE — Addendum Note (Signed)
Addended by: [name] R on: 03/08/2018 01:28 PM   Modules accepted: Orders

## 2018-03-10 ENCOUNTER — Encounter: Payer: Self-pay | Admitting: Family Medicine

## 2018-03-10 ENCOUNTER — Ambulatory Visit: Payer: BLUE CROSS/BLUE SHIELD | Admitting: Family Medicine

## 2018-03-10 VITALS — BP 124/78 | HR 64 | Temp 98.2°F | Ht 63.0 in | Wt 238.4 lb

## 2018-03-10 DIAGNOSIS — E8881 Metabolic syndrome: Secondary | ICD-10-CM | POA: Diagnosis not present

## 2018-03-10 DIAGNOSIS — E88819 Insulin resistance, unspecified: Secondary | ICD-10-CM | POA: Insufficient documentation

## 2018-03-10 DIAGNOSIS — F339 Major depressive disorder, recurrent, unspecified: Secondary | ICD-10-CM

## 2018-03-10 DIAGNOSIS — Z9989 Dependence on other enabling machines and devices: Secondary | ICD-10-CM

## 2018-03-10 DIAGNOSIS — G4733 Obstructive sleep apnea (adult) (pediatric): Secondary | ICD-10-CM | POA: Insufficient documentation

## 2018-03-10 LAB — IRON,TIBC AND FERRITIN PANEL
%SAT: 19 % (calc) (ref 16–45)
Ferritin: 109 ng/mL (ref 16–232)
Iron: 67 ug/dL (ref 45–160)
TIBC: 360 mcg/dL (calc) (ref 250–450)

## 2018-03-10 LAB — INSULIN, FREE (BIOACTIVE): Insulin, Free: 20.3 u[IU]/mL — ABNORMAL HIGH (ref 1.5–14.9)

## 2018-03-10 MED ORDER — VENLAFAXINE HCL ER 37.5 MG PO CP24: 37.5000 mg | ORAL_CAPSULE | Freq: Every day | ORAL | 0 refills | Status: DC

## 2018-03-10 NOTE — Progress Notes (Signed)
Brooke Clarke is a 57 y.o. female is here for follow up.  History of Present Illness:   Brooke Clarke CMA acting as scribe for [name].  HPI: Patient comes in today for a couple concerns. Her depression has gotten worse and she is just dealing with a lot. She has an appointment with [name] at the end of September. She is not sure that she wants to start medication at this time. No SI. Lack motivation.  Prediabetes: [name] has prescribed Metformin for the patient to take for her prediabetes. Patient stated that she has never been told that she has prediabetes before. She is questioning whether or not she can take depression medication and diabetes medication at the same time. She is interested in nutrition education.  Results for orders placed or performed in visit on 03/04/18  Iron, TIBC and Ferritin Panel  Result Value Ref Range   Iron 67 45 - 160 mcg/dL   TIBC 360 250 - 450 mcg/dL (calc)   %SAT 19 16 - 45 % (calc)   Ferritin 109 16 - 232 ng/mL  Hemoglobin A1c  Result Value Ref Range   Hgb A1c MFr Bld 5.9 4.6 - 6.5 %  Vitamin B12  Result Value Ref Range   Vitamin B-12 761 211 - 911 pg/mL  VITAMIN D 25 Hydroxy (Vit-D Deficiency, Fractures)  Result Value Ref Range   VITD 26.55 (L) 30.00 - 100.00 ng/mL  TSH  Result Value Ref Range   TSH 2.03 0.35 - 4.50 uIU/mL  CBC with Differential/Platelet  Result Value Ref Range   WBC 6.6 4.0 - 10.5 K/uL   RBC 4.64 3.87 - 5.11 Mil/uL   Hemoglobin 14.1 12.0 - 15.0 g/dL   HCT 41.5 36.0 - 46.0 %   MCV 89.5 78.0 - 100.0 fl   MCHC 34.0 30.0 - 36.0 g/dL   RDW 14.0 11.5 - 15.5 %   Platelets 194.0 150.0 - 400.0 K/uL   Neutrophils Relative % 60.7 43.0 - 77.0 %   Lymphocytes Relative 30.6 12.0 - 46.0 %   Monocytes Relative 5.5 3.0 - 12.0 %   Eosinophils Relative 2.6 0.0 - 5.0 %   Basophils Relative 0.6 0.0 - 3.0 %   Neutro Abs 4.0 1.4 - 7.7 K/uL   Lymphs Abs 2.0 0.7 - 4.0 K/uL   Monocytes Absolute 0.4 0.1 - 1.0 K/uL   Eosinophils Absolute 0.2 0.0 - 0.7 K/uL   Basophils Absolute 0.0 0.0 - 0.1 K/uL  Comprehensive metabolic panel  Result Value Ref Range   Sodium 140 135 - 145 mEq/L   Potassium 4.2 3.5 - 5.1 mEq/L   Chloride 106 96 - 112 mEq/L   CO2 27 19 - 32 mEq/L   Glucose, Bld 110 (H) 70 - 99 mg/dL   BUN 15 6 - 23 mg/dL   Creatinine, Ser 0.70 0.40 - 1.20 mg/dL   Total Bilirubin 0.3 0.2 - 1.2 mg/dL   Alkaline Phosphatase 83 39 - 117 U/L   AST 15 0 - 37 U/L   ALT 27 0 - 35 U/L   Total Protein 6.8 6.0 - 8.3 g/dL   Albumin 4.3 3.5 - 5.2 g/dL   Calcium 9.5 8.4 - 10.5 mg/dL   GFR 91.02 >60.00 mL/min  Insulin, Free (Bioactive)  Result Value Ref Range   Insulin, Free 20.3 (H) 1.5 - 14.9 uIU/mL   The 10-year ASCVD risk score [name], et al., 2013) is: 2.5%*   Values used to calculate  the score:     Age: 30 years     Sex: Female     Is Non-Hispanic African American: No     Diabetic: No     Tobacco smoker: No     Systolic Blood Pressure: 408 mmHg     Is BP treated: No     HDL Cholesterol: 52 mg/dL*     Total Cholesterol: 165 mg/dL*     * - Cholesterol units were assumed for this score calculation  Health Maintenance Due  Topic Date Due  . PAP SMEAR  08/25/2016   Depression screen [facility] 2/9 03/04/2018 03/04/2018 02/21/2016  Decreased Interest 3 0 (No Data)  Down, Depressed, Hopeless 3 0 -  PHQ - 2 Score 6 0 -  Altered sleeping 2 - -  Tired, decreased energy 3 - -  Change in appetite 3 - -  Feeling bad or failure about yourself  3 - -  Trouble concentrating 2 - -  Moving slowly or fidgety/restless 0 - -  Suicidal thoughts 0 - -  PHQ-9 Score 19 - -  Difficult doing work/chores Somewhat difficult - -   PMHx, SurgHx, SocialHx, FamHx, Medications, and Allergies were reviewed in the Visit Navigator and updated as appropriate.   Patient Active Problem List   Diagnosis Date Noted  . Insulin resistance 03/10/2018  . OSA on CPAP 03/10/2018  . Osteoarthritis of right knee 10/28/2017  .  Spondylosis of lumbar joint 07/31/2017  . Subjective tinnitus of both ears 12/24/2016  . Chronic pain in right foot 07/23/2016  . Chronic pain in left foot 07/23/2016  . Sciatica of left side 07/19/2012  . Plantar fibromatosis 03/03/2012  . HSV-2 (herpes simplex virus 2) infection 07/19/2011  . Depression, recurrent ([place]) 01/23/2010   Social History   Tobacco Use  . Smoking status: Never Smoker  . Smokeless tobacco: Never Used  Substance Use Topics  . Alcohol use: Not on file  . Drug use: Not on file   Current Medications and Allergies:   .  metFORMIN (GLUCOPHAGE XR) 750 MG 24 hr tablet, Take 1 tablet (750 mg total) by mouth daily with breakfast., Disp: 30 tablet, Rfl: 3 .  omega-3 acid ethyl esters (LOVAZA) 1 g capsule, Take by mouth 2 (two) times daily., Disp: , Rfl:  .  valACYclovir (VALTREX) 500 MG tablet, Take 1 tablet (500 mg total) by mouth daily., Disp: 30 tablet, Rfl: 2   Allergies  Allergen Reactions  . Hydrocodone-Acetaminophen Rash    itchy   Review of Systems   Pertinent items are noted in the HPI. Otherwise, ROS is negative.  Vitals:   Vitals:   03/10/18 0731  BP: 124/78  Pulse: 64  Temp: 98.2 F (36.8 C)  TempSrc: Oral  SpO2: 97%  Weight: 238 lb 6.4 oz (108.1 kg)  Height: 5\' 3"  (1.6 m)     Body mass index is 42.23 kg/m.  Physical Exam:   Physical Exam  Constitutional: She is oriented to person, place, and time. She appears well-developed and well-nourished. No distress.  HENT:  Head: Normocephalic and atraumatic.  Right Ear: External ear normal.  Left Ear: External ear normal.  Nose: Nose normal.  Mouth/Throat: Oropharynx is clear and moist.  Eyes: Pupils are equal, round, and reactive to light. Conjunctivae and EOM are normal.  Neck: Normal range of motion. Neck supple. No thyromegaly present.  Cardiovascular: Normal rate, regular rhythm, normal heart sounds and intact distal pulses.  Pulmonary/Chest: Effort normal and breath sounds  normal.  Abdominal: Soft. Bowel sounds are normal.  Musculoskeletal: Normal range of motion.  Lymphadenopathy:    She has no cervical adenopathy.  Neurological: She is alert and oriented to person, place, and time.  Skin: Skin is warm and dry. Capillary refill takes less than 2 seconds.  Psychiatric: She has a normal mood and affect. Her behavior is normal.  Nursing note and vitals reviewed.  Assessment and Plan:   Wendelin was seen today for depression.  Diagnoses and all orders for this visit:  Depression, recurrent ([place]) Comments: After discussion, patient would like to start below medication. Expectations, risks, and potential side effects reviewed. She has an upcoming appointment with [name].  Orders: -     venlafaxine XR (EFFEXOR XR) 37.5 MG 24 hr capsule; Take 1 capsule (37.5 mg total) by mouth daily with breakfast.  Insulin resistance Comments: We reviewed her labs and discussed insulin resistance. She is open to trying Metformin. Appointment for [employer] set up.  . Reviewed expectations re: course of current medical issues. . Discussed self-management of symptoms. . Outlined signs and symptoms indicating need for more acute intervention. . Patient verbalized understanding and all questions were answered. Marland Kitchen Health Maintenance issues including appropriate healthy diet, exercise, and smoking avoidance were discussed with patient. . See orders for this visit as documented in the electronic medical record. . Patient received an After Visit Summary.  [name], DO [place] 03/10/2018  Future Appointments  Date Time Provider [place]  05/05/2018 10:00 AM [place] Sherrilyn Rist, [place] [facility] None  05/13/2018 10:00 AM [place] Sherrilyn Rist, [place] [facility] None  05/20/2018 10:00 AM [place] Sherrilyn Rist, [place] [facility] None  06/03/2018  9:00 AM [name], DO [facility]   CMA served as scribe during this visit. History, Physical, and  Plan performed by medical provider. The above documentation has been reviewed and is accurate and complete. [name], D.O.

## 2018-03-10 NOTE — Patient Instructions (Signed)
Please follow up with Brooke Clarke for nutrition education.

## 2018-03-24 ENCOUNTER — Encounter: Payer: Self-pay | Admitting: Physician Assistant

## 2018-03-24 ENCOUNTER — Ambulatory Visit: Payer: BLUE CROSS/BLUE SHIELD | Admitting: Physician Assistant

## 2018-03-24 VITALS — BP 116/74 | HR 62 | Temp 98.0°F | Ht 63.0 in | Wt 238.4 lb

## 2018-03-24 DIAGNOSIS — Z713 Dietary counseling and surveillance: Secondary | ICD-10-CM

## 2018-03-24 DIAGNOSIS — E8881 Metabolic syndrome: Secondary | ICD-10-CM | POA: Diagnosis not present

## 2018-03-24 DIAGNOSIS — E669 Obesity, unspecified: Secondary | ICD-10-CM | POA: Diagnosis not present

## 2018-03-24 NOTE — Progress Notes (Signed)
Brooke Clarke is a 57 y.o. female here for a new problem.  History of Present Illness:   Chief Complaint  Patient presents with  . Nutrition Counseling    HPI  Patient is here to discuss weight loss, making better choices for meals and snacks.  She reports that she does well for breakfast and lunch, however it is with dinner and weekends that she really struggles.  Husband is not a good support system -- buys a lot of junk food.    Out of the week, eats out at restaurants 3-4 days a week  Dietary recall: Breakfast -- 2 eggs (boiled), whole grain bread OR raisin bran with 2% milk and fruit; coffee with stevia and french vanilla creamer Lunch -- sandwich (ham and cheese) with fruit; water to drink Dinner -- [place] OR sloppy joe OR rotisserie chicken, slaw Snacks -- yogurt or fruit; water to drink Beverages  -- occasional unsweetened tea  Weight: Wt Readings from Last 3 Encounters:  03/24/18 238 lb 6.4 oz (108.1 kg)  03/10/18 238 lb 6.4 oz (108.1 kg)  03/04/18 240 lb 6.4 oz (109 kg)    Exercise: none  Support system: Daughter occasionally  Goals: 1-to change the foods I eat and to try myself to stop eating what ever I am in the mood for 2-to find healthy replacements 3-find foods that I can prepare  Past Efforts: Low carb, eating more salads and chicken  Estimated daily energy needs: Calories: 1550 - 1800 kcal Protein: 70-80 g Fluid: 2000 ml  Past Medical History:  Diagnosis Date  . Sleep apnea      Social History   Socioeconomic History  . Marital status: Single    Spouse name: Not on file  . Number of children: Not on file  . Years of education: Not on file  . Highest education level: Not on file  Occupational History  . Not on file  Social Needs  . Financial resource strain: Not on file  . Food insecurity:    Worry: Not on file    Inability: Not on file  . Transportation needs:    Medical: Not on file    Non-medical: Not on file  Tobacco Use   . Smoking status: Never Smoker  . Smokeless tobacco: Never Used  Substance and Sexual Activity  . Alcohol use: Not on file  . Drug use: Not on file  . Sexual activity: Not on file  Lifestyle  . Physical activity:    Days per week: Not on file    Minutes per session: Not on file  . Stress: Not on file  Relationships  . Social connections:    Talks on phone: Not on file    Gets together: Not on file    Attends religious service: Not on file    Active member of club or organization: Not on file    Attends meetings of clubs or organizations: Not on file    Relationship status: Not on file  . Intimate partner violence:    Fear of current or ex partner: Not on file    Emotionally abused: Not on file    Physically abused: Not on file    Forced sexual activity: Not on file  Other Topics Concern  . Not on file  Social History Narrative  . Not on file    Past Surgical History:  Procedure Laterality Date  . CHOLECYSTECTOMY    . FOOT SURGERY    . KNEE SURGERY  History reviewed. No pertinent family history.  Allergies  Allergen Reactions  . Hydrocodone-Acetaminophen Rash    itchy    Current Medications:   Current Outpatient Medications:  .  metFORMIN (GLUCOPHAGE XR) 750 MG 24 hr tablet, Take 1 tablet (750 mg total) by mouth daily with breakfast., Disp: 30 tablet, Rfl: 3 .  omega-3 acid ethyl esters (LOVAZA) 1 g capsule, Take by mouth 2 (two) times daily., Disp: , Rfl:  .  valACYclovir (VALTREX) 500 MG tablet, Take 1 tablet (500 mg total) by mouth daily., Disp: 30 tablet, Rfl: 2 .  venlafaxine XR (EFFEXOR XR) 37.5 MG 24 hr capsule, Take 1 capsule (37.5 mg total) by mouth daily with breakfast., Disp: 90 capsule, Rfl: 0   Review of Systems:   ROS  Negative unless otherwise specified per HPI.   Vitals:   Vitals:   03/24/18 0908  BP: 116/74  Pulse: 62  Temp: 98 F (36.7 C)  TempSrc: Oral  SpO2: 96%  Weight: 238 lb 6.4 oz (108.1 kg)  Height: 5\' 3"  (1.6 m)      Body mass index is 42.23 kg/m.  Physical Exam:   Physical Exam  Constitutional: She is oriented to person, place, and time. She appears well-developed and well-nourished.  HENT:  Head: Normocephalic and atraumatic.  Eyes: Conjunctivae and EOM are normal.  Neck: Normal range of motion. Neck supple.  Pulmonary/Chest: Effort normal.  Musculoskeletal: Normal range of motion.  Neurological: She is alert and oriented to person, place, and time.  Skin: Skin is warm and dry.  Psychiatric: She has a normal mood and affect. Her behavior is normal. Judgment and thought content normal.    Assessment and Plan:    [name] was seen today for nutrition counseling.  Diagnoses and all orders for this visit:  Insulin resistance  Obesity, unspecified classification, unspecified obesity type, unspecified whether serious comorbidity present  Encounter for nutritional counseling   Try to dictate today we reviewed her meals and snacks, I reviewed balance plate method.  We also discussed ways to improve her eating out habits.  We discussed portion sizes.  We also recommended changing her milk to skim milk.  Provided handouts including: Balance plate, 1600-calorie checklist, balance breakfast and balance snack list.  Follow-up as needed.   . Reviewed expectations re: course of current medical issues. . Discussed self-management of symptoms. . Outlined signs and symptoms indicating need for more acute intervention. . Patient verbalized understanding and all questions were answered. . See orders for this visit as documented in the electronic medical record. . Patient received an After-Visit Summary.   Inda Coke, PA-C

## 2018-03-24 NOTE — Patient Instructions (Addendum)
Let's change to skim milk.  When you are having cereal, add in a protein.  Check the serving sizes of your cereals.  Eat your veggies.  Follow-up with [place] if you need anything else -- we are here for you!

## 2018-04-09 ENCOUNTER — Encounter (HOSPITAL_BASED_OUTPATIENT_CLINIC_OR_DEPARTMENT_OTHER): Payer: Self-pay | Admitting: Emergency Medicine

## 2018-04-09 ENCOUNTER — Emergency Department (HOSPITAL_BASED_OUTPATIENT_CLINIC_OR_DEPARTMENT_OTHER)
Admission: EM | Admit: 2018-04-09 | Discharge: 2018-04-09 | Disposition: A | Discharge status: Home / Self Care | Payer: BLUE CROSS/BLUE SHIELD | Attending: Emergency Medicine | Admitting: Emergency Medicine

## 2018-04-09 ENCOUNTER — Other Ambulatory Visit: Payer: Self-pay

## 2018-04-09 DIAGNOSIS — R05 Cough: Secondary | ICD-10-CM | POA: Diagnosis not present

## 2018-04-09 DIAGNOSIS — Z7984 Long term (current) use of oral hypoglycemic drugs: Secondary | ICD-10-CM | POA: Insufficient documentation

## 2018-04-09 DIAGNOSIS — J069 Acute upper respiratory infection, unspecified: Secondary | ICD-10-CM | POA: Diagnosis not present

## 2018-04-09 DIAGNOSIS — Z79899 Other long term (current) drug therapy: Secondary | ICD-10-CM | POA: Insufficient documentation

## 2018-04-09 DIAGNOSIS — B9789 Other viral agents as the cause of diseases classified elsewhere: Secondary | ICD-10-CM | POA: Insufficient documentation

## 2018-04-09 MED ORDER — IBUPROFEN 400 MG PO TABS
600.0000 mg | ORAL_TABLET | Freq: Once | ORAL | Status: AC
  Administered 2018-04-09: 600 mg via ORAL
  Filled 2018-04-09: qty 1

## 2018-04-09 MED ORDER — PROMETHAZINE-DM 6.25-15 MG/5ML PO SYRP: 2.5000 mL | ORAL_SOLUTION | Freq: Four times a day (QID) | ORAL | 0 refills | Status: DC | PRN

## 2018-04-09 NOTE — ED Triage Notes (Signed)
Pt is c/o generalized body aches, chills, cough, and headache  sxs started this morning  Pt took some cough medicine earlier but states it has not helped

## 2018-04-09 NOTE — ED Notes (Signed)
ED Provider at bedside. 

## 2018-04-09 NOTE — ED Provider Notes (Signed)
[facility] Provider Note   CSN: [id number] Arrival date & time: 04/09/18  2053     History   Chief Complaint Chief Complaint  Patient presents with  . Generalized Body Aches  . Chills  . Cough    HPI Brooke Clarke is a 57 y.o. female.  The history is provided by the patient. No language interpreter was used.   Brooke Clarke is a 57 y.o. female who presents to the Emergency Department complaining of cough. She presents to the emergency department complaining of nasal congestion, cough, sore throat and chills since midnight. Cough is productive but she is not elected or sputum. She was previously in her normal state of health. No reports of fevers at home but she does have chills and body aches. No chest pain, hemoptysis, abdominal pain, vomiting, diarrhea. She has prediabetes, no additional medical problems. No known sick contacts. Symptoms are moderate and constant nature. She is tried a nighttime cold medicine today with no significant improvement in her symptoms.  Past Medical History:  Diagnosis Date  . Sleep apnea     Patient Active Problem List   Diagnosis Date Noted  . Insulin resistance 03/10/2018  . OSA on CPAP 03/10/2018  . Osteoarthritis of right knee 10/28/2017  . Spondylosis of lumbar joint 07/31/2017  . Subjective tinnitus of both ears 12/24/2016  . Chronic pain in right foot 07/23/2016  . Chronic pain in left foot 07/23/2016  . Sciatica of left side 07/19/2012  . Plantar fibromatosis 03/03/2012  . HSV-2 (herpes simplex virus 2) infection 07/19/2011  . Depression, recurrent ([place]) 01/23/2010    Past Surgical History:  Procedure Laterality Date  . CHOLECYSTECTOMY    . FOOT SURGERY    . KNEE SURGERY       OB History   None      Home Medications    Prior to Admission medications   Medication Sig Start Date End Date Taking? Authorizing Provider  metFORMIN (GLUCOPHAGE XR) 750 MG 24 hr tablet Take 1 tablet (750 mg  total) by mouth daily with breakfast. 03/08/18   [name], DO  omega-3 acid ethyl esters (LOVAZA) 1 g capsule Take by mouth 2 (two) times daily.    [provider]  promethazine-dextromethorphan (PROMETHAZINE-DM) 6.25-15 MG/5ML syrup Take 2.5 mLs by mouth 4 (four) times daily as needed for cough. 04/09/18   Quintella Reichert, MD  valACYclovir (VALTREX) 500 MG tablet Take 1 tablet (500 mg total) by mouth daily. 03/04/18   [name], DO  venlafaxine XR (EFFEXOR XR) 37.5 MG 24 hr capsule Take 1 capsule (37.5 mg total) by mouth daily with breakfast. 03/10/18   [name], DO    Family History Family History  Problem Relation Age of Onset  . Diabetes Father   . Stroke Other     Social History Social History   Tobacco Use  . Smoking status: Never Smoker  . Smokeless tobacco: Never Used  Substance Use Topics  . Alcohol use: Not Currently  . Drug use: Never     Allergies   Hydrocodone-acetaminophen   Review of Systems Review of Systems  All other systems reviewed and are negative.    Physical Exam Updated Vital Signs BP (!) 151/84 (BP Location: Right Arm)   Pulse 84   Temp 99.4 F (37.4 C) (Oral)   Resp 20   Ht 5\' 3"  (1.6 m)   Wt 99.8 kg   SpO2 98%   BMI 38.97 kg/m  Physical Exam  Constitutional: She is oriented to person, place, and time. She appears well-developed and well-nourished.  HENT:  Head: Normocephalic and atraumatic.  Rhinorrhea, TMs clear bilaterally. No erythema in posterior OP.  Cardiovascular: Normal rate and regular rhythm.  No murmur heard. Pulmonary/Chest: Effort normal and breath sounds normal. No respiratory distress.  Abdominal: Soft. There is no tenderness. There is no rebound and no guarding.  Musculoskeletal: She exhibits no edema or tenderness.  Neurological: She is alert and oriented to person, place, and time.  Skin: Skin is warm and dry.  Psychiatric: She has a normal mood and affect. Her behavior is normal.    Nursing note and vitals reviewed.    ED Treatments / Results  Labs (all labs ordered are listed, but only abnormal results are displayed) Labs Reviewed - No data to display  EKG None  Radiology No results found.  Procedures Procedures (including critical care time)  Medications Ordered in ED Medications  ibuprofen (ADVIL,MOTRIN) tablet 600 mg (600 mg Oral Given 04/09/18 2253)     Initial Impression / Assessment and Plan / ED Course  I have reviewed the triage vital signs and the nursing notes.  Pertinent labs & imaging results that were available during my care of the patient were reviewed by me and considered in my medical decision making (see chart for details).     Patient here for evaluation of nasal congestion, sore throat, cough. She is non-toxic appearing on examination with clear lungs. No clinical evidence of pneumonia, sepsis, chf. Discussed with patient home care for viral URI with symptomatic treatment. Discussed outpatient follow-up and return precautions.  Final Clinical Impressions(s) / ED Diagnoses   Final diagnoses:  Viral URI with cough    ED Discharge Orders         Ordered    promethazine-dextromethorphan (PROMETHAZINE-DM) 6.25-15 MG/5ML syrup  4 times daily PRN     04/09/18 2258           Quintella Reichert, MD 04/10/18 0010

## 2018-04-09 NOTE — ED Notes (Signed)
Patient is resting comfortably. 

## 2018-04-09 NOTE — Discharge Instructions (Addendum)
Take afrin, available over the counter twice daily for three days.  Take ibuprofen for the next several days according to label instructions.

## 2018-04-09 NOTE — ED Notes (Signed)
Family at bedside. 

## 2018-04-09 NOTE — ED Notes (Signed)
Pt's husband notified of wait time

## 2018-04-30 ENCOUNTER — Telehealth: Payer: Self-pay | Admitting: Family Medicine

## 2018-04-30 ENCOUNTER — Encounter: Payer: Self-pay | Admitting: Physician Assistant

## 2018-04-30 ENCOUNTER — Ambulatory Visit (INDEPENDENT_AMBULATORY_CARE_PROVIDER_SITE_OTHER): Payer: BLUE CROSS/BLUE SHIELD

## 2018-04-30 ENCOUNTER — Ambulatory Visit: Payer: BLUE CROSS/BLUE SHIELD | Admitting: Physician Assistant

## 2018-04-30 VITALS — BP 134/76 | HR 57 | Temp 98.8°F | Ht 63.0 in | Wt 229.2 lb

## 2018-04-30 DIAGNOSIS — R1030 Lower abdominal pain, unspecified: Secondary | ICD-10-CM

## 2018-04-30 DIAGNOSIS — R350 Frequency of micturition: Secondary | ICD-10-CM

## 2018-04-30 DIAGNOSIS — R197 Diarrhea, unspecified: Secondary | ICD-10-CM | POA: Diagnosis not present

## 2018-04-30 LAB — COMPREHENSIVE METABOLIC PANEL
ALBUMIN: 4.3 g/dL (ref 3.5–5.2)
ALK PHOS: 69 U/L (ref 39–117)
ALT: 35 U/L (ref 0–35)
AST: 25 U/L (ref 0–37)
BILIRUBIN TOTAL: 0.5 mg/dL (ref 0.2–1.2)
BUN: 13 mg/dL (ref 6–23)
CALCIUM: 9.5 mg/dL (ref 8.4–10.5)
CO2: 28 mEq/L (ref 19–32)
CREATININE: 0.89 mg/dL (ref 0.40–1.20)
Chloride: 102 mEq/L (ref 96–112)
GFR: 68.95 mL/min (ref >60.00)
Glucose, Bld: 137 mg/dL — ABNORMAL HIGH (ref 70–99)
Potassium: 3.5 mEq/L (ref 3.5–5.1)
Sodium: 138 mEq/L (ref 135–145)
TOTAL PROTEIN: 7.4 g/dL (ref 6.0–8.3)

## 2018-04-30 LAB — POCT URINALYSIS DIPSTICK
GLUCOSE UA: NEGATIVE
Nitrite, UA: NEGATIVE
Protein, UA: POSITIVE — AB
Spec Grav, UA: >=1.03 — AB (ref 1.010–1.025)
Urobilinogen, UA: 0.2 E.U./dL
pH, UA: 6 (ref 5.0–8.0)

## 2018-04-30 LAB — CBC WITH DIFFERENTIAL/PLATELET
BASOS ABS: 0 10*3/uL (ref 0.0–0.1)
Basophils Relative: 0.3 % (ref 0.0–3.0)
Eosinophils Absolute: 0.3 10*3/uL (ref 0.0–0.7)
Eosinophils Relative: 2.9 % (ref 0.0–5.0)
HEMATOCRIT: 43 % (ref 36.0–46.0)
HEMOGLOBIN: 15 g/dL (ref 12.0–15.0)
LYMPHS PCT: 30.2 % (ref 12.0–46.0)
Lymphs Abs: 2.7 10*3/uL (ref 0.7–4.0)
MCHC: 34.7 g/dL (ref 30.0–36.0)
MCV: 87.2 fl (ref 78.0–100.0)
MONOS PCT: 7.1 % (ref 3.0–12.0)
Monocytes Absolute: 0.6 10*3/uL (ref 0.1–1.0)
NEUTROS ABS: 5.3 10*3/uL (ref 1.4–7.7)
Neutrophils Relative %: 59.5 % (ref 43.0–77.0)
Platelets: 210 10*3/uL (ref 150.0–400.0)
RBC: 4.94 Mil/uL (ref 3.87–5.11)
RDW: 13.9 % (ref 11.5–15.5)
WBC: 8.9 10*3/uL (ref 4.0–10.5)

## 2018-04-30 LAB — LIPASE: LIPASE: 18 U/L (ref 11.0–59.0)

## 2018-04-30 MED ORDER — NITROFURANTOIN MONOHYD MACRO 100 MG PO CAPS: 100.0000 mg | ORAL_CAPSULE | Freq: Two times a day (BID) | ORAL | 0 refills | Status: AC

## 2018-04-30 NOTE — Telephone Encounter (Signed)
Copied from [place] [phone]. Topic: Quick Communication - See Telephone Encounter >> Apr 30, 2018  4:15 PM Vernona Rieger wrote: CRM for notification. See Telephone encounter for: 04/30/18.  Patient is requesting her lab results and xrays from this morning.

## 2018-04-30 NOTE — Patient Instructions (Signed)
Please increase your fluid intake. Hydration is key!  Get plenty of rest.   If you feel up to eating solid foods, try to follow the dietary guide at the bottom of your After Visit Summary. These foods are gentler on the stomach.  Start a daily probiotic. Some examples include Align, Digestive Advantage, Culturelle.   1. Stop Metformin 2. Stop Imodium 3. Limit sugary beverages 4. Walk around for a few minutes every few hours to help relieve gas. 5. Consider Gas-X 6. Start antibiotic for UTI. 7. We will contact you with urine and lab results.  If you are unable to keep fluids down by mouth, or if symptoms acutely worsen, please go to the ER as you may need IV fluids.    Food Choices to Help Relieve Diarrhea, Adult When you have diarrhea, the foods you eat and your eating habits are very important. Choosing the right foods and drinks can help relieve diarrhea. Also, because diarrhea can last up to 7 days, you need to replace lost fluids and electrolytes (such as sodium, potassium, and chloride) in order to help prevent dehydration.  WHAT GENERAL GUIDELINES DO I NEED TO FOLLOW?  Slowly drink 1 cup (8 oz) of fluid for each episode of diarrhea. If you are getting enough fluid, your urine will be clear or pale yellow.  Eat starchy foods. Some good choices include white rice, white toast, pasta, low-fiber cereal, baked potatoes (without the skin), saltine crackers, and bagels.  Avoid large servings of any cooked vegetables.  Limit fruit to two servings per day. A serving is  cup or 1 small piece.  Choose foods with less than 2 g of fiber per serving.  Limit fats to less than 8 tsp (38 g) per day.  Avoid fried foods.  Eat foods that have probiotics in them. Probiotics can be found in certain dairy products.  Avoid foods and beverages that may increase the speed at which food moves through the stomach and intestines (gastrointestinal tract). Things to avoid include:  High-fiber foods,  such as dried fruit, raw fruits and vegetables, nuts, seeds, and whole grain foods.  Spicy foods and high-fat foods.  Foods and beverages sweetened with high-fructose corn syrup, honey, or sugar alcohols such as xylitol, sorbitol, and mannitol. WHAT FOODS ARE RECOMMENDED? Grains White rice. White, [place], or pita breads (fresh or toasted), including plain rolls, buns, or bagels. White pasta. Saltine, soda, or graham crackers. Pretzels. Low-fiber cereal. Cooked cereals made with water (such as cornmeal, farina, or cream cereals). Plain muffins. Matzo. Melba toast. Zwieback.  Vegetables Potatoes (without the skin). Strained tomato and vegetable juices. Most well-cooked and canned vegetables without seeds. Tender lettuce. Fruits Cooked or canned applesauce, apricots, cherries, fruit cocktail, grapefruit, peaches, pears, or plums. Fresh bananas, apples without skin, cherries, grapes, cantaloupe, grapefruit, peaches, oranges, or plums.  Meat and Other Protein Products Baked or boiled chicken. Eggs. Tofu. Fish. Seafood. Smooth peanut butter. Ground or well-cooked tender beef, ham, veal, lamb, pork, or poultry.  Dairy Plain yogurt, kefir, and unsweetened liquid yogurt. Lactose-free milk, buttermilk, or soy milk. Plain hard cheese. Beverages Sport drinks. Clear broths. Diluted fruit juices (except prune). Regular, caffeine-free sodas such as ginger ale. Water. Decaffeinated teas. Oral rehydration solutions. Sugar-free beverages not sweetened with sugar alcohols. Other Bouillon, broth, or soups made from recommended foods.  The items listed above may not be a complete list of recommended foods or beverages. Contact your dietitian for more options. WHAT FOODS ARE NOT RECOMMENDED? Grains Whole grain,  whole wheat, bran, or rye breads, rolls, pastas, crackers, and cereals. Wild or brown rice. Cereals that contain more than 2 g of fiber per serving. Corn tortillas or taco shells. Cooked or dry oatmeal.  Granola. Popcorn. Vegetables Raw vegetables. Cabbage, broccoli, Brussels sprouts, artichokes, baked beans, beet greens, corn, kale, legumes, peas, sweet potatoes, and yams. Potato skins. Cooked spinach and cabbage. Fruits Dried fruit, including raisins and dates. Raw fruits. Stewed or dried prunes. Fresh apples with skin, apricots, mangoes, pears, raspberries, and strawberries.  Meat and Other Protein Products Chunky peanut butter. Nuts and seeds. Beans and lentils. Berniece Salines.  Dairy High-fat cheeses. Milk, chocolate milk, and beverages made with milk, such as milk shakes. Cream. Ice cream. Sweets and Desserts Sweet rolls, doughnuts, and sweet breads. Pancakes and waffles. Fats and Oils Butter. Cream sauces. Margarine. Salad oils. Plain salad dressings. Olives. Avocados.  Beverages Caffeinated beverages (such as coffee, tea, soda, or energy drinks). Alcoholic beverages. Fruit juices with pulp. Prune juice. Soft drinks sweetened with high-fructose corn syrup or sugar alcohols. Other Coconut. Hot sauce. Chili powder. Mayonnaise. Gravy. Cream-based or milk-based soups.  The items listed above may not be a complete list of foods and beverages to avoid. Contact your dietitian for more information. WHAT SHOULD I DO IF I BECOME DEHYDRATED? Diarrhea can sometimes lead to dehydration. Signs of dehydration include dark urine and dry mouth and skin. If you think you are dehydrated, you should rehydrate with an oral rehydration solution. These solutions can be purchased at pharmacies, retail stores, or online.  Drink -1 cup (120-240 mL) of oral rehydration solution each time you have an episode of diarrhea. If drinking this amount makes your diarrhea worse, try drinking smaller amounts more often. For example, drink 1-3 tsp (5-15 mL) every 5-10 minutes.  A general rule for staying hydrated is to drink 1-2 L of fluid per day. Talk to your health care provider about the specific amount you should be drinking  each day. Drink enough fluids to keep your urine clear or pale yellow.   This information is not intended to replace advice given to you by your health care provider. Make sure you discuss any questions you have with your health care provider.   Document Released: 10/19/2003 Document Revised: 08/19/2014 Document Reviewed: 06/21/2013 Elsevier Interactive Patient Education [employer].

## 2018-04-30 NOTE — Progress Notes (Signed)
Brooke Clarke is a 57 y.o. female here for a new problem.  I acted as a Education administrator for [employer], PA-C Anselmo Pickler, LPN  History of Present Illness:   Chief Complaint  Patient presents with  . Diarrhea    Diarrhea   This is a new problem. Episode onset: Started on Monday. The problem occurs 5 to 10 times per day. The problem has been waxing and waning. Associated symptoms include abdominal pain (cramps), bloating and chills. Pertinent negatives include no fever, headaches, increased  flatus or vomiting. Nothing aggravates the symptoms. There are no known risk factors. She has tried increased fluids (and Immodium) for the symptoms. The treatment provided mild relief.   Wt Readings from Last 5 Encounters:  04/30/18 229 lb 4 oz (104 kg)  04/09/18 220 lb (99.8 kg)  03/24/18 238 lb 6.4 oz (108.1 kg)  03/10/18 238 lb 6.4 oz (108.1 kg)  03/04/18 240 lb 6.4 oz (109 kg)   Yesterday ate a banana, toast and saltines. Drinking low calorie gatorade. She also endorses urinary frequency but no pain with urination. She is drinking plenty of fluids.  Currently having Type 6 and Type 7 Bristol stool. Reports that she has Type 3 stools at baseline.  She is taking Imodium presently every 4 hours. Has been on Metformin XR for almost two months, states that it has never caused diarrhea for her. She denies recent travel.   Has prior cholecystectomy.  Past Medical History:  Diagnosis Date  . Sleep apnea      Social History   Socioeconomic History  . Marital status: Single    Spouse name: Not on file  . Number of children: Not on file  . Years of education: Not on file  . Highest education level: Not on file  Occupational History  . Not on file  Social Needs  . Financial resource strain: Not on file  . Food insecurity:    Worry: Not on file    Inability: Not on file  . Transportation needs:    Medical: Not on file    Non-medical: Not on file  Tobacco Use  . Smoking status: Never Smoker   . Smokeless tobacco: Never Used  Substance and Sexual Activity  . Alcohol use: Not Currently  . Drug use: Never  . Sexual activity: Not on file  Lifestyle  . Physical activity:    Days per week: Not on file    Minutes per session: Not on file  . Stress: Not on file  Relationships  . Social connections:    Talks on phone: Not on file    Gets together: Not on file    Attends religious service: Not on file    Active member of club or organization: Not on file    Attends meetings of clubs or organizations: Not on file    Relationship status: Not on file  . Intimate partner violence:    Fear of current or ex partner: Not on file    Emotionally abused: Not on file    Physically abused: Not on file    Forced sexual activity: Not on file  Other Topics Concern  . Not on file  Social History Narrative  . Not on file    Past Surgical History:  Procedure Laterality Date  . CHOLECYSTECTOMY    . FOOT SURGERY    . KNEE SURGERY      Family History  Problem Relation Age of Onset  . Diabetes Father   .  Stroke Other     Allergies  Allergen Reactions  . Hydrocodone-Acetaminophen Rash    itchy    Current Medications:   Current Outpatient Medications:  .  metFORMIN (GLUCOPHAGE XR) 750 MG 24 hr tablet, Take 1 tablet (750 mg total) by mouth daily with breakfast., Disp: 30 tablet, Rfl: 3 .  omega-3 acid ethyl esters (LOVAZA) 1 g capsule, Take by mouth 2 (two) times daily., Disp: , Rfl:  .  valACYclovir (VALTREX) 500 MG tablet, Take 1 tablet (500 mg total) by mouth daily., Disp: 30 tablet, Rfl: 2 .  venlafaxine XR (EFFEXOR XR) 37.5 MG 24 hr capsule, Take 1 capsule (37.5 mg total) by mouth daily with breakfast., Disp: 90 capsule, Rfl: 0 .  nitrofurantoin, macrocrystal-monohydrate, (MACROBID) 100 MG capsule, Take 1 capsule (100 mg total) by mouth 2 (two) times daily for 5 days., Disp: 10 capsule, Rfl: 0   Review of Systems:   Review of Systems  Constitutional: Positive for chills.  Negative for fever.  Gastrointestinal: Positive for abdominal pain (cramps), bloating and diarrhea. Negative for flatus and vomiting.  Neurological: Negative for headaches.    Vitals:   Vitals:   04/30/18 1056  BP: 134/76  Pulse: (!) 57  Temp: 98.8 F (37.1 C)  TempSrc: Oral  Weight: 229 lb 4 oz (104 kg)  Height: 5\' 3"  (1.6 m)     Body mass index is 40.61 kg/m.  Physical Exam:   Physical Exam  Constitutional: She appears well-developed. She is cooperative.  Non-toxic appearance. She does not have a sickly appearance. She does not appear ill. No distress.  Cardiovascular: Normal rate, regular rhythm, S1 normal, S2 normal, normal heart sounds and normal pulses.  No LE edema  Pulmonary/Chest: Effort normal and breath sounds normal.  Abdominal: Normal appearance and bowel sounds are normal. There is no tenderness. There is no rigidity, no rebound, no guarding and no CVA tenderness.  Neurological: She is alert. GCS eye subscore is 4. GCS verbal subscore is 5. GCS motor subscore is 6.  Skin: Skin is warm, dry and intact.  Psychiatric: She has a normal mood and affect. Her speech is normal and behavior is normal.  Nursing note and vitals reviewed.   Results for orders placed or performed in visit on 04/30/18  CBC with Differential/Platelet  Result Value Ref Range   WBC 8.9 4.0 - 10.5 K/uL   RBC 4.94 3.87 - 5.11 Mil/uL   Hemoglobin 15.0 12.0 - 15.0 g/dL   HCT 43.0 36.0 - 46.0 %   MCV 87.2 78.0 - 100.0 fl   MCHC 34.7 30.0 - 36.0 g/dL   RDW 13.9 11.5 - 15.5 %   Platelets 210.0 150.0 - 400.0 K/uL   Neutrophils Relative % 59.5 43.0 - 77.0 %   Lymphocytes Relative 30.2 12.0 - 46.0 %   Monocytes Relative 7.1 3.0 - 12.0 %   Eosinophils Relative 2.9 0.0 - 5.0 %   Basophils Relative 0.3 0.0 - 3.0 %   Neutro Abs 5.3 1.4 - 7.7 K/uL   Lymphs Abs 2.7 0.7 - 4.0 K/uL   Monocytes Absolute 0.6 0.1 - 1.0 K/uL   Eosinophils Absolute 0.3 0.0 - 0.7 K/uL   Basophils Absolute 0.0 0.0 - 0.1  K/uL  Comprehensive metabolic panel  Result Value Ref Range   Sodium 138 135 - 145 mEq/L   Potassium 3.5 3.5 - 5.1 mEq/L   Chloride 102 96 - 112 mEq/L   CO2 28 19 - 32 mEq/L   Glucose, Bld  137 (H) 70 - 99 mg/dL   BUN 13 6 - 23 mg/dL   Creatinine, Ser 0.89 0.40 - 1.20 mg/dL   Total Bilirubin 0.5 0.2 - 1.2 mg/dL   Alkaline Phosphatase 69 39 - 117 U/L   AST 25 0 - 37 U/L   ALT 35 0 - 35 U/L   Total Protein 7.4 6.0 - 8.3 g/dL   Albumin 4.3 3.5 - 5.2 g/dL   Calcium 9.5 8.4 - 10.5 mg/dL   GFR 68.95 >60.00 mL/min  Lipase  Result Value Ref Range   Lipase 18.0 11.0 - 59.0 U/L  POCT urinalysis dipstick  Result Value Ref Range   Color, UA Amber    Clarity, UA Cloudy    Glucose, UA Negative Negative   Bilirubin, UA Small (1+)    Ketones, UA Moderate (2+)    Spec Grav, UA >=1.030 (A) 1.010 - 1.025   Blood, UA Small (1+)    pH, UA 6.0 5.0 - 8.0   Protein, UA Positive (A) Negative   Urobilinogen, UA 0.2 0.2 or 1.0 E.U./dL   Nitrite, UA Negative    Leukocytes, UA Small (1+) (A) Negative   Appearance     Odor     Abd xray: negative  Assessment and Plan:    Brooke Clarke was seen today for diarrhea.  Diagnoses and all orders for this visit:  Lower abdominal pain and Urinary frequency Abdominal xray normal. UA positive for blood, protein and leukocytes will treat with macrobid per orders. Urine culture pending. Labs essentially normal, slight elevation in glucose. Discussed bland diet, pushing fluids, holding metformin, decreasing Imodium intake to avoid worsening pain and contributing to constipation. Follow-up if symptoms worsen or persist despite treatment. Vitals stable in office. -     DG Abd 2 Views; Future -     POCT urinalysis dipstick -     CBC with Differential/Platelet -     Comprehensive metabolic panel -     Lipase -     Urine Culture  Other orders -     nitrofurantoin, macrocrystal-monohydrate, (MACROBID) 100 MG capsule; Take 1 capsule (100 mg total) by mouth 2 (two) times  daily for 5 days.   . Reviewed expectations re: course of current medical issues. . Discussed self-management of symptoms. . Outlined signs and symptoms indicating need for more acute intervention. . Patient verbalized understanding and all questions were answered. . See orders for this visit as documented in the electronic medical record. . Patient received an After-Visit Summary.  CMA or LPN served as scribe during this visit. History, Physical, and Plan performed by medical provider. The above documentation has been reviewed and is accurate and complete.  Inda Coke, PA-C

## 2018-05-01 ENCOUNTER — Encounter: Payer: Self-pay | Admitting: Physician Assistant

## 2018-05-01 LAB — URINE CULTURE
MICRO NUMBER:: 91126371
SPECIMEN QUALITY: ADEQUATE

## 2018-05-01 NOTE — Telephone Encounter (Signed)
Samantha notified pt called for results from yesterday. Samantha sent pt My Chart message with results. Pt viewed results on My Chart.

## 2018-05-04 DIAGNOSIS — G4733 Obstructive sleep apnea (adult) (pediatric): Secondary | ICD-10-CM | POA: Diagnosis not present

## 2018-05-05 ENCOUNTER — Ambulatory Visit (INDEPENDENT_AMBULATORY_CARE_PROVIDER_SITE_OTHER): Payer: BLUE CROSS/BLUE SHIELD | Admitting: Psychology

## 2018-05-05 DIAGNOSIS — F320 Major depressive disorder, single episode, mild: Secondary | ICD-10-CM

## 2018-05-13 ENCOUNTER — Ambulatory Visit (INDEPENDENT_AMBULATORY_CARE_PROVIDER_SITE_OTHER): Payer: BLUE CROSS/BLUE SHIELD | Admitting: Psychology

## 2018-05-13 DIAGNOSIS — F320 Major depressive disorder, single episode, mild: Secondary | ICD-10-CM

## 2018-05-19 ENCOUNTER — Encounter: Payer: Self-pay | Admitting: Sports Medicine

## 2018-05-19 ENCOUNTER — Other Ambulatory Visit: Payer: Self-pay | Admitting: Family Medicine

## 2018-05-19 DIAGNOSIS — F339 Major depressive disorder, recurrent, unspecified: Secondary | ICD-10-CM

## 2018-05-20 ENCOUNTER — Ambulatory Visit: Payer: Self-pay | Admitting: Psychology

## 2018-05-27 ENCOUNTER — Ambulatory Visit (INDEPENDENT_AMBULATORY_CARE_PROVIDER_SITE_OTHER): Payer: BLUE CROSS/BLUE SHIELD | Admitting: Psychology

## 2018-05-27 DIAGNOSIS — F320 Major depressive disorder, single episode, mild: Secondary | ICD-10-CM

## 2018-05-30 DIAGNOSIS — S83242A Other tear of medial meniscus, current injury, left knee, initial encounter: Secondary | ICD-10-CM | POA: Diagnosis not present

## 2018-05-30 DIAGNOSIS — M25462 Effusion, left knee: Secondary | ICD-10-CM | POA: Diagnosis not present

## 2018-06-02 DIAGNOSIS — M25562 Pain in left knee: Secondary | ICD-10-CM | POA: Diagnosis not present

## 2018-06-03 ENCOUNTER — Encounter: Payer: Self-pay | Admitting: Family Medicine

## 2018-06-03 ENCOUNTER — Ambulatory Visit: Payer: BLUE CROSS/BLUE SHIELD | Admitting: Family Medicine

## 2018-06-03 VITALS — BP 148/80 | HR 64 | Temp 98.3°F | Ht 63.0 in | Wt 237.4 lb

## 2018-06-03 DIAGNOSIS — R413 Other amnesia: Secondary | ICD-10-CM

## 2018-06-03 DIAGNOSIS — F339 Major depressive disorder, recurrent, unspecified: Secondary | ICD-10-CM

## 2018-06-03 DIAGNOSIS — M25562 Pain in left knee: Secondary | ICD-10-CM

## 2018-06-03 DIAGNOSIS — E8881 Metabolic syndrome: Secondary | ICD-10-CM

## 2018-06-03 DIAGNOSIS — E88819 Insulin resistance, unspecified: Secondary | ICD-10-CM

## 2018-06-03 DIAGNOSIS — Z23 Encounter for immunization: Secondary | ICD-10-CM | POA: Diagnosis not present

## 2018-06-03 LAB — POCT GLYCOSYLATED HEMOGLOBIN (HGB A1C): Hemoglobin A1C: 5.4 % (ref 4.0–5.6)

## 2018-06-03 NOTE — Progress Notes (Signed)
Brooke Clarke is a 57 y.o. female is here for follow up.  History of Present Illness:   HPI:   1. Insulin resistance. Improved though weight is up. Tolerating Metformin.  Lab Results  Component Value Date   HGBA1C 5.4 06/03/2018     2. Diarrhea. Hx of diarrhea. Note reviewed. Held Metformin during that time. Was treated for UTI with Macrobid. Remembered that she had a colonoscopy previously that showed colitis. No abdominal pain or diarrhea now.    3. Acute pain of left knee. MRI scheduled for this week. Followed by [facility].    4. Memory changes. Forgetful and with some word-finding difficulties. She wonders if the Effexor is partly responsible. Sleeping well, using CPAP. No new supplements or medications otherwise.     Health Maintenance Due  Topic Date Due  . PAP SMEAR  08/25/2016  . INFLUENZA VACCINE  03/12/2018   Depression screen [facility] 2/9 03/04/2018 03/04/2018 02/21/2016  Decreased Interest 3 0 (No Data)  Down, Depressed, Hopeless 3 0 -  PHQ - 2 Score 6 0 -  Altered sleeping 2 - -  Tired, decreased energy 3 - -  Change in appetite 3 - -  Feeling bad or failure about yourself  3 - -  Trouble concentrating 2 - -  Moving slowly or fidgety/restless 0 - -  Suicidal thoughts 0 - -  PHQ-9 Score 19 - -  Difficult doing work/chores Somewhat difficult - -   PMHx, SurgHx, SocialHx, FamHx, Medications, and Allergies were reviewed in the Visit Navigator and updated as appropriate.   Patient Active Problem List   Diagnosis Date Noted  . Insulin resistance 03/10/2018  . OSA on CPAP 03/10/2018  . Osteoarthritis of right knee 10/28/2017  . Spondylosis of lumbar joint 07/31/2017  . Subjective tinnitus of both ears 12/24/2016  . Chronic pain in right foot 07/23/2016  . Chronic pain in left foot 07/23/2016  . Sciatica of left side 07/19/2012  . Plantar fibromatosis 03/03/2012  . HSV-2 (herpes simplex virus 2) infection 07/19/2011  . Depression, recurrent ([place]) 01/23/2010    Social History   Tobacco Use  . Smoking status: Never Smoker  . Smokeless tobacco: Never Used  Substance Use Topics  . Alcohol use: Not Currently  . Drug use: Never   Current Medications and Allergies:   .  metFORMIN (GLUCOPHAGE XR) 750 MG 24 hr tablet, Take 1 tablet (750 mg total) by mouth daily with breakfast., Disp: 30 tablet, Rfl: 3 .  omega-3 acid ethyl esters (LOVAZA) 1 g capsule, Take by mouth 2 (two) times daily., Disp: , Rfl:  .  valACYclovir (VALTREX) 500 MG tablet, Take 1 tablet (500 mg total) by mouth daily., Disp: 30 tablet, Rfl: 2 .  venlafaxine XR (EFFEXOR-XR) 37.5 MG 24 hr capsule, TAKE 1 CAPSULE(37.5 MG) BY MOUTH DAILY WITH BREAKFAST, Disp: 90 capsule, Rfl: 0   Allergies  Allergen Reactions  . Hydrocodone-Acetaminophen Rash    itchy   Review of Systems   Pertinent items are noted in the HPI. Otherwise, ROS is negative.  Vitals:   Vitals:   06/03/18 0911  BP: (!) 148/80  Pulse: 64  Temp: 98.3 F (36.8 C)  TempSrc: Oral  SpO2: 96%  Weight: 237 lb 6.4 oz (107.7 kg)  Height: 5\' 3"  (1.6 m)     Body mass index is 42.05 kg/m.  Physical Exam:   Physical Exam  Constitutional: She appears well-nourished.  HENT:  Head: Normocephalic and atraumatic.  Eyes: Pupils are  equal, round, and reactive to light. EOM are normal.  Neck: Normal range of motion. Neck supple.  Cardiovascular: Normal rate, regular rhythm, normal heart sounds and intact distal pulses.  Pulmonary/Chest: Effort normal.  Abdominal: Soft.  Skin: Skin is warm.  Psychiatric: She has a normal mood and affect. Her behavior is normal.  Nursing note and vitals reviewed.  Assessment and Plan:   [name] was seen today for follow-up.  Diagnoses and all orders for this visit:  Insulin resistance Comments: Improved. Continue Metformin. Orders: -     POCT glycosylated hemoglobin (Hb A1C)  Depression, recurrent ([facility]) Comments: Offered to change Effexor. She would like to hold.   Acute  pain of left knee  Memory changes Comments: Will monitor and send to [facility] is worsening.   Need for immunization against influenza -     Flu Vaccine QUAD 36+ mos IM    . Reviewed expectations re: course of current medical issues. . Discussed self-management of symptoms. . Outlined signs and symptoms indicating need for more acute intervention. . Patient verbalized understanding and all questions were answered. Marland Kitchen Health Maintenance issues including appropriate healthy diet, exercise, and smoking avoidance were discussed with patient. . See orders for this visit as documented in the electronic medical record. . Patient received an After Visit Summary.  [name], DO [place], [facility] 06/03/2018

## 2018-06-11 DIAGNOSIS — M25562 Pain in left knee: Secondary | ICD-10-CM | POA: Diagnosis not present

## 2018-06-16 DIAGNOSIS — M25562 Pain in left knee: Secondary | ICD-10-CM | POA: Diagnosis not present

## 2018-06-16 DIAGNOSIS — S83249A Other tear of medial meniscus, current injury, unspecified knee, initial encounter: Secondary | ICD-10-CM | POA: Insufficient documentation

## 2018-06-16 DIAGNOSIS — M1712 Unilateral primary osteoarthritis, left knee: Secondary | ICD-10-CM | POA: Diagnosis not present

## 2018-06-16 DIAGNOSIS — S83242D Other tear of medial meniscus, current injury, left knee, subsequent encounter: Secondary | ICD-10-CM | POA: Diagnosis not present

## 2018-06-18 ENCOUNTER — Telehealth: Payer: Self-pay

## 2018-06-18 NOTE — Telephone Encounter (Signed)
Fax received for patient from ortho for surgical clearance. Called patient  l/m to call office will need to make app for form to be filled out.  Form put in blue new patient ppw folder to hold

## 2018-06-23 ENCOUNTER — Encounter: Payer: Self-pay | Admitting: *Deleted

## 2018-06-23 NOTE — Telephone Encounter (Signed)
Left message on voicemail to call office. Pt needs to schedule pre-op clearance appt with [name].

## 2018-06-23 NOTE — Telephone Encounter (Signed)
Copied from [place] [phone]. Topic: Quick Communication - See Telephone Encounter >> Jun 23, 2018  5:01 PM Vernona Rieger wrote: CRM for notification. See Telephone encounter for: 06/23/18.  Patient said she would like to know could Dr Juleen China work her in for surgical clearance before the next available appt , which is 11/25 as of right now. Patient said she can not be scheduled for surgery until this appointment has been done. Patient is trying to have her surgery in December. Patient states she was just in on 10/23. Please advise.

## 2018-06-24 ENCOUNTER — Ambulatory Visit: Payer: BLUE CROSS/BLUE SHIELD | Admitting: Psychology

## 2018-06-24 NOTE — Telephone Encounter (Signed)
See note

## 2018-06-25 NOTE — Telephone Encounter (Signed)
Called patient had emergency at work and will not have surgery anytime soon. Will call back later to make app.

## 2018-06-29 DIAGNOSIS — S83289A Other tear of lateral meniscus, current injury, unspecified knee, initial encounter: Secondary | ICD-10-CM | POA: Insufficient documentation

## 2018-06-29 NOTE — Telephone Encounter (Signed)
Spoke to pt told her needs appt with [name] for surgical clearance. Pt said she spoke to someone and told them she was going to hold off on surgery for now. Told pt okay sorry.

## 2018-06-30 ENCOUNTER — Ambulatory Visit: Payer: BLUE CROSS/BLUE SHIELD | Admitting: Psychology

## 2018-06-30 DIAGNOSIS — M1712 Unilateral primary osteoarthritis, left knee: Secondary | ICD-10-CM | POA: Diagnosis not present

## 2018-06-30 DIAGNOSIS — S83282D Other tear of lateral meniscus, current injury, left knee, subsequent encounter: Secondary | ICD-10-CM | POA: Diagnosis not present

## 2018-06-30 DIAGNOSIS — S83242D Other tear of medial meniscus, current injury, left knee, subsequent encounter: Secondary | ICD-10-CM | POA: Diagnosis not present

## 2018-07-16 ENCOUNTER — Telehealth: Payer: Self-pay

## 2018-07-16 NOTE — Telephone Encounter (Signed)
Called patient needs app for pre op for [facility]. Called an l/m to call office to make app so that we can fill out. Holding in blue folder.

## 2018-07-22 DIAGNOSIS — E119 Type 2 diabetes mellitus without complications: Secondary | ICD-10-CM | POA: Diagnosis not present

## 2018-07-23 NOTE — Telephone Encounter (Signed)
Pt stated she had already spoke with someone about pre op appt and stated she is not going to schedule yet. She has too much going on at the moment.

## 2018-07-23 NOTE — Telephone Encounter (Signed)
Can you call patient for app for pre op for me ?

## 2018-07-25 ENCOUNTER — Other Ambulatory Visit: Payer: Self-pay | Admitting: Family Medicine

## 2018-08-19 ENCOUNTER — Other Ambulatory Visit: Payer: Self-pay | Admitting: Family Medicine

## 2018-08-19 DIAGNOSIS — F339 Major depressive disorder, recurrent, unspecified: Secondary | ICD-10-CM

## 2018-09-01 NOTE — Progress Notes (Signed)
Brooke Clarke is a 58 y.o. female is here for follow up.  History of Present Illness:   I, Gwenyth Ober , Cabin crew as a Education administrator for [employer], DO, have documented all relevant documentation on the behalf of [name], DO, as directed by  [name], DO while in the presence of [name], DO.  HPI: Current symptoms include depressed mood, difficulty concentrating, fatigue, feelings of worthlessness/guilt, hopelessness, hypersomnia and impaired memory. Symptoms have been gradually improving. Patient denies current suicidal and homicidal ideation. Previous treatment includes: medication patient also saw Lattie Haw and stated that it helped a little. Patient has bible study with some women at her church and they help her a lot with her depression. Side effects from the treatment: none. Alcohol use: occasionally has a glass of wine.  Drug use: none.  Exercise: none.   Health Maintenance Due  Topic Date Due  . PAP SMEAR-Modifier  08/25/2016  . MAMMOGRAM  07/25/2018   Depression screen [facility] 2/9 09/02/2018 03/04/2018 03/04/2018  Decreased Interest 2 3 0  Down, Depressed, Hopeless 2 3 0  PHQ - 2 Score 4 6 0  Altered sleeping 1 2 -  Tired, decreased energy 2 3 -  Change in appetite 2 3 -  Feeling bad or failure about yourself  2 3 -  Trouble concentrating 2 2 -  Moving slowly or fidgety/restless 1 0 -  Suicidal thoughts 0 0 -  PHQ-9 Score 14 19 -  Difficult doing work/chores Somewhat difficult Somewhat difficult -   PMHx, SurgHx, SocialHx, FamHx, Medications, and Allergies were reviewed in the Visit Navigator and updated as appropriate.   Patient Active Problem List   Diagnosis Date Noted  . History of breast augmentation 09/03/2018  . Insulin resistance 03/10/2018  . OSA on CPAP 03/10/2018  . Osteoarthritis of right knee 10/28/2017  . Spondylosis of lumbar joint 07/31/2017  . Subjective tinnitus of both ears 12/24/2016  . Chronic pain in right foot 07/23/2016  . Chronic pain  in left foot 07/23/2016  . Sciatica of left side 07/19/2012  . Plantar fibromatosis 03/03/2012  . HSV-2 (herpes simplex virus 2) infection 07/19/2011  . Depression, recurrent ([place]) 01/23/2010   Social History   Tobacco Use  . Smoking status: Never Smoker  . Smokeless tobacco: Never Used  Substance Use Topics  . Alcohol use: Not Currently  . Drug use: Never   Current Medications and Allergies:   .  metFORMIN (GLUCOPHAGE-XR) 750 MG 24 hr tablet, TAKE 1 TABLET(750 MG) BY MOUTH DAILY WITH BREAKFAST, Disp: 90 tablet, Rfl: 1 .  omega-3 acid ethyl esters (LOVAZA) 1 g capsule, Take by mouth 2 (two) times daily., Disp: , Rfl:  .  valACYclovir (VALTREX) 500 MG tablet, Take 1 tablet (500 mg total) by mouth daily., Disp: 30 tablet, Rfl: 2 .  venlafaxine XR (EFFEXOR-XR) 37.5 MG 24 hr capsule, TAKE 1 CAPSULE(37.5 MG) BY MOUTH DAILY WITH BREAKFAST, Disp: 90 capsule, Rfl: 0   Allergies  Allergen Reactions  . Hydrocodone-Acetaminophen Itching    itchy   Review of Systems   Pertinent items are noted in the HPI. Otherwise, a complete ROS is negative.  Vitals:   Vitals:   09/02/18 1019  BP: 122/60  Pulse: 72  Temp: 98.5 F (36.9 C)  TempSrc: Oral  SpO2: 96%  Weight: 235 lb (106.6 kg)  Height: 5\' 3"  (1.6 m)     Body mass index is 41.63 kg/m.  Physical Exam:   Physical Exam Vitals signs and  nursing note reviewed.  HENT:     Head: Normocephalic and atraumatic.     Nose:     Right Sinus: Maxillary sinus tenderness present.     Left Sinus: Maxillary sinus tenderness present.  Eyes:     Pupils: Pupils are equal, round, and reactive to light.  Neck:     Musculoskeletal: Normal range of motion and neck supple.  Cardiovascular:     Rate and Rhythm: Normal rate and regular rhythm.     Heart sounds: Normal heart sounds.  Pulmonary:     Effort: Pulmonary effort is normal.     Breath sounds: Wheezing and rhonchi present.  Abdominal:     Palpations: Abdomen is soft.  Skin:     General: Skin is warm.  Psychiatric:        Behavior: Behavior normal.    Assessment and Plan:   Brooke Clarke was seen today for follow-up and cough.  Diagnoses and all orders for this visit:  Depression, recurrent ([place]) Comments: Will increase Effexor to 75 mg daily.   Insulin resistance -     CBC with Differential/Platelet -     Comprehensive metabolic panel  Medication management -     CBC with Differential/Platelet -     Comprehensive metabolic panel  Breast pain -     Ambulatory referral to Plastic Surgery  History of breast augmentation Comments: Pateint with Hx of 2 breast surgeries. Now, with pain in neck and breasts due to size. Would like reduction. Will refer to breast surgeon.  Orders: -     Ambulatory referral to Plastic Surgery  Cough Comments: New. 10 days of productive cough, with wheeze, worse at night. Also associated with sinus pressure.  Orders: -     predniSONE (DELTASONE) 5 MG tablet; 6-5-4-3-2-1-off -     azithromycin (ZITHROMAX) 250 MG tablet; 2 po on day one, then one po daily until gone -     chlorpheniramine-HYDROcodone (TUSSIONEX PENNKINETIC ER) 10-8 MG/5ML SUER; Take 5 mLs by mouth at bedtime as needed for cough.   . Orders and follow up as documented in [place], reviewed diet, exercise and weight control, cardiovascular risk and specific lipid/LDL goals reviewed, reviewed medications and side effects in detail.  . Reviewed expectations re: course of current medical issues. . Outlined signs and symptoms indicating need for more acute intervention. . Patient verbalized understanding and all questions were answered. . Patient received an After Visit Summary.  CMA served as Education administrator during this visit. History, Physical, and Plan performed by medical provider. The above documentation has been reviewed and is accurate and complete. [name], D.O.  [name], DO [place], [facility] 09/03/2018

## 2018-09-02 ENCOUNTER — Encounter: Payer: Self-pay | Admitting: Family Medicine

## 2018-09-02 ENCOUNTER — Ambulatory Visit (INDEPENDENT_AMBULATORY_CARE_PROVIDER_SITE_OTHER): Payer: BLUE CROSS/BLUE SHIELD | Admitting: Family Medicine

## 2018-09-02 VITALS — BP 122/60 | HR 72 | Temp 98.5°F | Ht 63.0 in | Wt 235.0 lb

## 2018-09-02 DIAGNOSIS — M79671 Pain in right foot: Secondary | ICD-10-CM | POA: Diagnosis not present

## 2018-09-02 DIAGNOSIS — F339 Major depressive disorder, recurrent, unspecified: Secondary | ICD-10-CM

## 2018-09-02 DIAGNOSIS — Z79899 Other long term (current) drug therapy: Secondary | ICD-10-CM

## 2018-09-02 DIAGNOSIS — N644 Mastodynia: Secondary | ICD-10-CM

## 2018-09-02 DIAGNOSIS — Z9882 Breast implant status: Secondary | ICD-10-CM

## 2018-09-02 DIAGNOSIS — R05 Cough: Secondary | ICD-10-CM

## 2018-09-02 DIAGNOSIS — E8881 Metabolic syndrome: Secondary | ICD-10-CM

## 2018-09-02 DIAGNOSIS — R059 Cough, unspecified: Secondary | ICD-10-CM

## 2018-09-02 DIAGNOSIS — G8929 Other chronic pain: Secondary | ICD-10-CM

## 2018-09-02 LAB — CBC WITH DIFFERENTIAL/PLATELET
Basophils Absolute: 0 10*3/uL (ref 0.0–0.1)
Basophils Relative: 0.4 % (ref 0.0–3.0)
Eosinophils Absolute: 0.2 10*3/uL (ref 0.0–0.7)
Eosinophils Relative: 2.1 % (ref 0.0–5.0)
HCT: 43.7 % (ref 36.0–46.0)
Hemoglobin: 14.7 g/dL (ref 12.0–15.0)
Lymphocytes Relative: 34 % (ref 12.0–46.0)
Lymphs Abs: 2.8 10*3/uL (ref 0.7–4.0)
MCHC: 33.6 g/dL (ref 30.0–36.0)
MCV: 89.9 fl (ref 78.0–100.0)
Monocytes Absolute: 0.6 10*3/uL (ref 0.1–1.0)
Monocytes Relative: 6.9 % (ref 3.0–12.0)
Neutro Abs: 4.6 10*3/uL (ref 1.4–7.7)
Neutrophils Relative %: 56.6 % (ref 43.0–77.0)
Platelets: 197 10*3/uL (ref 150.0–400.0)
RBC: 4.86 Mil/uL (ref 3.87–5.11)
RDW: 13.9 % (ref 11.5–15.5)
WBC: 8.1 10*3/uL (ref 4.0–10.5)

## 2018-09-02 LAB — COMPREHENSIVE METABOLIC PANEL
ALT: 25 U/L (ref 0–35)
AST: 16 U/L (ref 0–37)
Albumin: 4.2 g/dL (ref 3.5–5.2)
Alkaline Phosphatase: 94 U/L (ref 39–117)
BUN: 12 mg/dL (ref 6–23)
CO2: 27 mEq/L (ref 19–32)
Calcium: 9.4 mg/dL (ref 8.4–10.5)
Chloride: 106 mEq/L (ref 96–112)
Creatinine, Ser: 0.69 mg/dL (ref 0.40–1.20)
GFR: 86.92 mL/min (ref >60.00)
Glucose, Bld: 94 mg/dL (ref 70–99)
Potassium: 4 mEq/L (ref 3.5–5.1)
Sodium: 140 mEq/L (ref 135–145)
Total Bilirubin: 0.3 mg/dL (ref 0.2–1.2)
Total Protein: 6.6 g/dL (ref 6.0–8.3)

## 2018-09-02 MED ORDER — PREDNISONE 5 MG PO TABS: ORAL_TABLET | ORAL | 0 refills | Status: DC

## 2018-09-02 MED ORDER — HYDROCOD POLST-CPM POLST ER 10-8 MG/5ML PO SUER: 5.0000 mL | Freq: Every evening | ORAL | 0 refills | Status: DC | PRN

## 2018-09-02 MED ORDER — AZITHROMYCIN 250 MG PO TABS: ORAL_TABLET | ORAL | 0 refills | Status: DC

## 2018-09-03 ENCOUNTER — Encounter: Payer: Self-pay | Admitting: Family Medicine

## 2018-09-03 DIAGNOSIS — Z9882 Breast implant status: Secondary | ICD-10-CM

## 2018-09-03 HISTORY — DX: Breast implant status: Z98.82

## 2018-09-15 ENCOUNTER — Encounter: Payer: Self-pay | Admitting: Plastic Surgery

## 2018-09-15 ENCOUNTER — Ambulatory Visit: Payer: BLUE CROSS/BLUE SHIELD | Admitting: Plastic Surgery

## 2018-09-15 VITALS — BP 124/76 | HR 66 | Temp 98.8°F | Ht 63.0 in | Wt 235.0 lb

## 2018-09-15 DIAGNOSIS — Z719 Counseling, unspecified: Secondary | ICD-10-CM | POA: Insufficient documentation

## 2018-09-15 DIAGNOSIS — Z9882 Breast implant status: Secondary | ICD-10-CM | POA: Diagnosis not present

## 2018-09-15 NOTE — Progress Notes (Signed)
     Patient ID: Brooke Clarke, female    DOB: 03-30-1961, 58 y.o.   MRN: [id number]   Chief Complaint  Patient presents with  . Breast Problem    The patient is a 58 year old white female here for evaluation for her breasts.  She underwent breast augmentation in 6 years ago a mastopexy most recently in September 2014 a smooth round moderate classic profile [place]or implant placed with 800 cc on the right and 750 cc on the left.  A style 7000 smooth she is 5 feet 3 inches tall and weighs 235 pounds.  It appears that her first implants were saline were then replaced with 640 cc gel implants and then increase to what was mentioned above.  Today she complains of the implants being too large under her arms very heavy.  She is finding it difficult to exercise and even clap in [place].   Review of Systems  Constitutional: Negative.   HENT: Negative.   Eyes: Negative.   Respiratory: Negative.  Negative for chest tightness and shortness of breath.   Gastrointestinal: Negative.  Negative for abdominal pain.  Endocrine: Negative.   Genitourinary: Negative.   Musculoskeletal: Negative.  Negative for joint swelling.  Skin: Negative for color change and wound.    Past Medical History:  Diagnosis Date  . Sleep apnea     Past Surgical History:  Procedure Laterality Date  . CHOLECYSTECTOMY    . FOOT SURGERY    . KNEE SURGERY        Current Outpatient Medications:  .  metFORMIN (GLUCOPHAGE-XR) 750 MG 24 hr tablet, TAKE 1 TABLET(750 MG) BY MOUTH DAILY WITH BREAKFAST, Disp: 90 tablet, Rfl: 1 .  venlafaxine XR (EFFEXOR-XR) 37.5 MG 24 hr capsule, TAKE 1 CAPSULE(37.5 MG) BY MOUTH DAILY WITH BREAKFAST, Disp: 90 capsule, Rfl: 0   Objective:   Vitals:   09/15/18 0939  BP: 124/76  Pulse: 66  Temp: 98.8 F (37.1 C)  SpO2: 96%    Physical Exam Vitals signs and nursing note reviewed.  Constitutional:      Appearance: Normal appearance.  HENT:     Head: Normocephalic.     Mouth/Throat:   Mouth: Mucous membranes are moist.  Cardiovascular:     Rate and Rhythm: Normal rate.  Pulmonary:     Effort: Pulmonary effort is normal.  Abdominal:     General: Abdomen is flat. There is no distension.     Tenderness: There is no abdominal tenderness.  Neurological:     General: No focal deficit present.     Mental Status: She is alert.  Psychiatric:        Mood and Affect: Mood normal.        Thought Content: Thought content normal.        Judgment: Judgment normal.     Assessment & Plan:  History of breast augmentation  Encounter for counseling A quote was given to the patient by [place] for removal and replacement of silicone implants with a smaller size.  Most likely she would need to be in the 500 range.  She was also given the quote for a mastopexy as well.  She is going to think things over and let us know which she would like to do. [place], DO

## 2018-09-16 DIAGNOSIS — M1712 Unilateral primary osteoarthritis, left knee: Secondary | ICD-10-CM | POA: Diagnosis not present

## 2018-09-22 DIAGNOSIS — M1712 Unilateral primary osteoarthritis, left knee: Secondary | ICD-10-CM | POA: Diagnosis not present

## 2018-09-30 DIAGNOSIS — M1712 Unilateral primary osteoarthritis, left knee: Secondary | ICD-10-CM | POA: Diagnosis not present

## 2018-10-20 ENCOUNTER — Telehealth: Payer: Self-pay | Admitting: Family Medicine

## 2018-10-20 DIAGNOSIS — F339 Major depressive disorder, recurrent, unspecified: Secondary | ICD-10-CM

## 2018-10-23 MED ORDER — VENLAFAXINE HCL ER 37.5 MG PO CP24: 37.5000 mg | ORAL_CAPSULE | Freq: Two times a day (BID) | ORAL | 0 refills | Status: DC

## 2018-10-23 NOTE — Addendum Note (Signed)
Addended by: Jasper Loser on: 10/23/2018 02:09 PM   Modules accepted: Orders

## 2018-10-23 NOTE — Telephone Encounter (Signed)
Pt states please end to [facility] [place], [place] [address]  [phone]    venlafaxine XR (EFFEXOR-XR) 37.5 MG 24 hr capsule  2 a day  Thanks!!

## 2018-10-23 NOTE — Telephone Encounter (Signed)
Okay refill at current dose. May need to send to a different pharmacy.

## 2018-10-23 NOTE — Telephone Encounter (Signed)
See note

## 2018-10-23 NOTE — Telephone Encounter (Signed)
[name], [place] for med change/increase?

## 2018-10-23 NOTE — Telephone Encounter (Signed)
Pt has tried to get refill from the pharmacy but they told her it is too early. Pt is taking 2/day of 37.5mg  effexor as advised at [place] 09/15/2018 with [name] (for total 75mg ). Pt needing updated RX sent in today before 12:00pm  as she is going out of town and has been out of medicine. Please advise.   [id number] - [place], [place] - [address] [place] [address] [phone] (Phone) [phone] (Fax)

## 2018-10-23 NOTE — Telephone Encounter (Signed)
Rx sent to [facility] in [facility] for #180/0 to take one capsule BID.

## 2018-11-02 DIAGNOSIS — G4733 Obstructive sleep apnea (adult) (pediatric): Secondary | ICD-10-CM | POA: Diagnosis not present

## 2018-12-02 ENCOUNTER — Ambulatory Visit: Payer: BLUE CROSS/BLUE SHIELD | Admitting: Family Medicine

## 2018-12-06 NOTE — Progress Notes (Signed)
Virtual Visit via Video   Due to the COVID-19 pandemic, this visit was completed with telemedicine (audio/video) technology to reduce patient and provider exposure as well as to preserve personal protective equipment.   I connected with Brooke Clarke by a video enabled telemedicine application and verified that I am speaking with the correct person using two identifiers. Location patient: Home Location provider:  [facility], Office Persons participating in the virtual visit: Brooke Clarke, Y[place], DO Brooke Clarke, CMA acting as scribe for Dr. Briscoe Clarke.   I discussed the limitations of evaluation and management by telemedicine and the availability of in person appointments. The patient expressed understanding and agreed to proceed.  Care Team   Patient Care Team: Brooke Deutscher, DO as PCP - General (Family Medicine)  Subjective:   HPI:  Chief Complaint  Patient presents with  . Follow-up  . Depression    Venlafaxine BID. Tolerating well, controlling sx well.   . Insulin Resistance    Metformin 750 mg daily. No side effects.   . Breast Pain    Sx have resolved. implant has come out from under muscle wall. She will have to pay to have redone. She will hold off until after Covid to see if she would like to repeat.    Review of Systems  Constitutional: Negative for chills and fever.  HENT: Negative for hearing loss and tinnitus.   Eyes: Negative for blurred vision and double vision.  Respiratory: Negative for cough.   Cardiovascular: Negative for chest pain.  Gastrointestinal: Negative for heartburn.  Genitourinary: Negative for dysuria.  Musculoskeletal: Negative for myalgias.  Skin: Negative for rash.  Neurological: Negative for dizziness and headaches.  Endo/Heme/Allergies: Does not bruise/bleed easily.  Psychiatric/Behavioral: Negative for depression.     Patient Active Problem List   Diagnosis Date Noted  . History of breast augmentation 09/03/2018    . Tear of lateral meniscus of knee 06/29/2018  . Tear of medial meniscus of knee 06/16/2018  . Insulin resistance 03/10/2018  . OSA on CPAP 03/10/2018  . Osteoarthritis of right knee 10/28/2017  . Spondylosis of lumbar joint 07/31/2017  . Subjective tinnitus of both ears 12/24/2016  . Chronic pain in right foot 07/23/2016  . Chronic pain in left foot 07/23/2016  . Sciatica of left side 07/19/2012  . Plantar fibromatosis 03/03/2012  . HSV-2 (herpes simplex virus 2) infection 07/19/2011  . Depression, recurrent ([place]) 01/23/2010    Social History   Tobacco Use  . Smoking status: Never Smoker  . Smokeless tobacco: Never Used  Substance Use Topics  . Alcohol use: Not Currently    Current Outpatient Medications:  .  metFORMIN (GLUCOPHAGE-XR) 750 MG 24 hr tablet, TAKE 1 TABLET(750 MG) BY MOUTH DAILY WITH BREAKFAST, Disp: 90 tablet, Rfl: 1 .  venlafaxine XR (EFFEXOR-XR) 37.5 MG 24 hr capsule, Take 1 capsule (37.5 mg total) by mouth 2 (two) times daily., Disp: 180 capsule, Rfl: 0  Allergies  Allergen Reactions  . Hydrocodone-Acetaminophen Itching    itchy   Objective:   VITALS: Per patient if applicable, see vitals. GENERAL: Alert, appears well and in no acute distress. HEENT: Atraumatic, conjunctiva clear, no obvious abnormalities on inspection of external nose and ears. NECK: Normal movements of the head and neck. CARDIOPULMONARY: No increased WOB. Speaking in clear sentences. I:E ratio WNL.  MS: Moves all visible extremities without noticeable abnormality. PSYCH: Pleasant and cooperative, well-groomed. Speech normal rate and rhythm. Affect is appropriate. Insight and judgement are appropriate. Attention  is focused, linear, and appropriate.  NEURO: CN grossly intact. Oriented as arrived to appointment on time with no prompting. Moves both UE equally.  SKIN: No obvious lesions, wounds, erythema, or cyanosis noted on face or hands.  Depression screen [facility] 2/9 12/07/2018 09/02/2018  03/04/2018  Decreased Interest 1 2 3   Down, Depressed, Hopeless 0 2 3  PHQ - 2 Score 1 4 6   Altered sleeping 0 1 2  Tired, decreased energy 1 2 3   Change in appetite 1 2 3   Feeling bad or failure about yourself  0 2 3  Trouble concentrating 0 2 2  Moving slowly or fidgety/restless 0 1 0  Suicidal thoughts 0 0 0  PHQ-9 Score 3 14 19   Difficult doing work/chores Somewhat difficult Somewhat difficult Somewhat difficult    Assessment and Plan:   [place] was seen today for follow-up, depression, insulin resistance and breast pain.  Diagnoses and all orders for this visit:  Insulin resistance Comments: Doing well.  Continue current treatment.  Depression, recurrent ([place]) Comments: Fairly controlled at this time despite COVID-19.  Continue current treatment.  OSA on CPAP Comments: Tolerating without issues.  Educated About Covid-19 Virus Infection    . COVID-19 Education:The signs and symptoms of COVID-19 were discussed with the patient and how to seek care for testing if needed. The importance of social distancing was discussed today. . Reviewed expectations re: course of current medical issues. . Discussed self-management of symptoms. . Outlined signs and symptoms indicating need for more acute intervention. . Patient verbalized understanding and all questions were answered. Marland Kitchen Health Maintenance issues including appropriate healthy diet, exercise, and smoking avoidance were discussed with patient. . See orders for this visit as documented in the electronic medical record.  Brooke Deutscher, DO 12/12/2018  Records requested if needed. Time spent: 25 minutes, of which >50% was spent in obtaining information about her symptoms, reviewing her previous labs, evaluations, and treatments, counseling her about her condition (please see the discussed topics above), and developing a plan to further investigate it; she had a number of questions which I addressed.

## 2018-12-07 ENCOUNTER — Encounter: Payer: Self-pay | Admitting: Family Medicine

## 2018-12-07 ENCOUNTER — Ambulatory Visit (INDEPENDENT_AMBULATORY_CARE_PROVIDER_SITE_OTHER): Payer: BLUE CROSS/BLUE SHIELD | Admitting: Family Medicine

## 2018-12-07 VITALS — Ht 63.0 in | Wt 220.0 lb

## 2018-12-07 DIAGNOSIS — Z7189 Other specified counseling: Secondary | ICD-10-CM

## 2018-12-07 DIAGNOSIS — E8881 Metabolic syndrome: Secondary | ICD-10-CM

## 2018-12-07 DIAGNOSIS — F339 Major depressive disorder, recurrent, unspecified: Secondary | ICD-10-CM

## 2018-12-07 DIAGNOSIS — Z9989 Dependence on other enabling machines and devices: Secondary | ICD-10-CM

## 2018-12-07 DIAGNOSIS — G4733 Obstructive sleep apnea (adult) (pediatric): Secondary | ICD-10-CM | POA: Diagnosis not present

## 2018-12-07 DIAGNOSIS — E88819 Insulin resistance, unspecified: Secondary | ICD-10-CM

## 2018-12-12 ENCOUNTER — Encounter: Payer: Self-pay | Admitting: Family Medicine

## 2019-01-14 ENCOUNTER — Other Ambulatory Visit: Payer: Self-pay | Admitting: Family Medicine

## 2019-01-14 DIAGNOSIS — F339 Major depressive disorder, recurrent, unspecified: Secondary | ICD-10-CM

## 2019-01-19 ENCOUNTER — Telehealth: Payer: Self-pay | Admitting: Family Medicine

## 2019-01-19 NOTE — Telephone Encounter (Signed)
See note  Copied from [place] [id number]. Topic: General - Other >> Jan 19, 2019  2:43 PM Mcneil, Ja-Kwan wrote: Reason for CRM: Pt stated she and [name] discussed her taking a stronger dose of the venlafaxine XR (EFFEXOR-XR) capsule so she only had to take it once daily but when she went to pick up the Rx the dosage had not changed. Pt requests a call back.

## 2019-01-19 NOTE — Telephone Encounter (Signed)
Please advise should it been increased?

## 2019-01-20 ENCOUNTER — Other Ambulatory Visit: Payer: Self-pay

## 2019-01-20 DIAGNOSIS — F339 Major depressive disorder, recurrent, unspecified: Secondary | ICD-10-CM

## 2019-01-20 MED ORDER — METFORMIN HCL ER 750 MG PO TB24: ORAL_TABLET | ORAL | 1 refills | Status: DC

## 2019-01-20 MED ORDER — VENLAFAXINE HCL ER 75 MG PO CP24: 75.0000 mg | ORAL_CAPSULE | Freq: Two times a day (BID) | ORAL | 0 refills | Status: DC

## 2019-01-20 NOTE — Telephone Encounter (Signed)
Yes - to 75 mg po daily. Please send in new Rx.

## 2019-01-20 NOTE — Telephone Encounter (Signed)
Called in new script updated chart and let patient know.

## 2019-03-11 DIAGNOSIS — Z20828 Contact with and (suspected) exposure to other viral communicable diseases: Secondary | ICD-10-CM | POA: Diagnosis not present

## 2019-03-16 ENCOUNTER — Encounter: Payer: Self-pay | Admitting: Physician Assistant

## 2019-03-16 ENCOUNTER — Other Ambulatory Visit: Payer: Self-pay

## 2019-03-16 ENCOUNTER — Ambulatory Visit: Payer: BLUE CROSS/BLUE SHIELD | Admitting: Physician Assistant

## 2019-03-16 ENCOUNTER — Ambulatory Visit (INDEPENDENT_AMBULATORY_CARE_PROVIDER_SITE_OTHER): Payer: BC Managed Care – PPO | Admitting: Physician Assistant

## 2019-03-16 VITALS — Ht 63.0 in | Wt 200.0 lb

## 2019-03-16 DIAGNOSIS — R110 Nausea: Secondary | ICD-10-CM | POA: Diagnosis not present

## 2019-03-16 DIAGNOSIS — M791 Myalgia, unspecified site: Secondary | ICD-10-CM

## 2019-03-16 DIAGNOSIS — R51 Headache: Secondary | ICD-10-CM

## 2019-03-16 DIAGNOSIS — Z7189 Other specified counseling: Secondary | ICD-10-CM | POA: Diagnosis not present

## 2019-03-16 DIAGNOSIS — R6889 Other general symptoms and signs: Secondary | ICD-10-CM | POA: Diagnosis not present

## 2019-03-16 DIAGNOSIS — R05 Cough: Secondary | ICD-10-CM | POA: Diagnosis not present

## 2019-03-16 DIAGNOSIS — Z20822 Contact with and (suspected) exposure to covid-19: Secondary | ICD-10-CM

## 2019-03-16 DIAGNOSIS — R0600 Dyspnea, unspecified: Secondary | ICD-10-CM

## 2019-03-16 NOTE — Progress Notes (Signed)
Virtual Visit via Video   I connected with [place] on 03/16/19 at  8:00 AM EDT by a video enabled telemedicine application and verified that I am speaking with the correct person using two identifiers. Location patient: Home Location provider: [facility], Office Persons participating in the virtual visit: Kriti L Zilah, [place] PA-C.  I discussed the limitations of evaluation and management by telemedicine and the availability of in person appointments. The patient expressed understanding and agreed to proceed.  I acted as a Education administrator for [employer], PA-C [employer], LPN  Subjective:   HPI:   Patient is requesting evaluation for possible COVID-19.  Symptom onset: Sunday  Travel or Contacts: Pt was exposed last week, daughter dx with COVID-19.  Tested last Thursday at [facility] testing site. States that it was negative.  Patient endorses the following symptoms: Fever >100.6F []   Yes []   No [x]   Unknown Subjective fever (felt feverish) [x]   Yes []   No []   Unknown Chills [x]   Yes []   No []   Unknown Muscle aches (myalgia) [x]   Yes []   No []   Unknown Runny nose (rhinorrhea) [x]   Yes []   No []   Unknown Sore throat []   Yes [x]   No []   Unknown Cough (new onset or worsening of chronic cough) [x]   Yes []   No []   Unknown Shortness of breath (dyspnea) []   Yes [x]   No []   Unknown Nausea or vomiting []   Yes [x]   No []   Unknown Headache [x]   Yes []   No []   Unknown -- HA is 7/10 Abdominal pain  []   Yes [x]   No []   Unknown Diarrhea (?3 loose/looser than normal stools/24hr period) []   Yes [x]   No []   Unknown  Other, specify:  Treatments tried: Mucinex and Advil  Patient risk factors: Smoker? []   Current []   Former [x]   Never If female, currently pregnant? []   Yes [x]   No  ROS: See pertinent positives and negatives per HPI.  Patient Active Problem List   Diagnosis Date Noted   History of breast augmentation 09/03/2018   Tear of lateral meniscus of knee  06/29/2018   Tear of medial meniscus of knee 06/16/2018   Insulin resistance 03/10/2018   OSA on CPAP 03/10/2018   Osteoarthritis of right knee 10/28/2017   Spondylosis of lumbar joint 07/31/2017   Subjective tinnitus of both ears 12/24/2016   Chronic pain in right foot 07/23/2016   Chronic pain in left foot 07/23/2016   Sciatica of left side 07/19/2012   Plantar fibromatosis 03/03/2012   HSV-2 (herpes simplex virus 2) infection 07/19/2011   Depression, recurrent ([place]) 01/23/2010    Social History   Tobacco Use   Smoking status: Never Smoker   Smokeless tobacco: Never Used  Substance Use Topics   Alcohol use: Not Currently    Current Outpatient Medications:    metFORMIN (GLUCOPHAGE-XR) 750 MG 24 hr tablet, TAKE 1 TABLET(750 MG) BY MOUTH DAILY WITH BREAKFAST, Disp: 90 tablet, Rfl: 1   venlafaxine XR (EFFEXOR-XR) 75 MG 24 hr capsule, Take 1 capsule (75 mg total) by mouth 2 (two) times daily. (Patient taking differently: Take 75 mg by mouth daily with breakfast. ), Disp: 90 capsule, Rfl: 0  Allergies  Allergen Reactions   Hydrocodone-Acetaminophen Itching    itchy    Objective:   VITALS: Per patient if applicable, see vitals. GENERAL: Alert, appears well and in no acute distress. HEENT: Atraumatic, conjunctiva clear, no obvious abnormalities on inspection  of external nose and ears. NECK: Normal movements of the head and neck. CARDIOPULMONARY: No increased WOB. Speaking in clear sentences. I:E ratio WNL.  MS: Moves all visible extremities without noticeable abnormality. PSYCH: Pleasant and cooperative, well-groomed. Speech normal rate and rhythm. Affect is appropriate. Insight and judgement are appropriate. Attention is focused, linear, and appropriate.  NEURO: CN grossly intact. Oriented as arrived to appointment on time with no prompting. Moves both UE equally.  SKIN: No obvious lesions, wounds, erythema, or cyanosis noted on face or hands.  Assessment and  Plan:   Vonceil was seen today for covid-19 exposure.  Diagnoses and all orders for this visit:  Suspected Covid-19 Virus Infection -     Novel Coronavirus, NAA ([facility])  Advice Given About Covid-19 Virus Infection -     Novel Coronavirus, NAA ([facility])   Patient has a respiratory illness without signs of acute distress or respiratory compromise at this time. This is likely a viral infection, which can come from a number of respiratory viruses. We are going to send patient for drive-up testing. As a precaution, they have been advised to remain home until COVID-19 results and then possible further quarantine after that based on results and symptoms. Advised if they experience a "second sickening" or worsening symptoms as the illness progresses, they are to call the office for further instructions or seek emergent evaluation for any severe symptoms.    Reviewed expectations re: course of current medical issues.  Discussed self-management of symptoms.  Outlined signs and symptoms indicating need for more acute intervention.  Patient verbalized understanding and all questions were answered.  Health Maintenance issues including appropriate healthy diet, exercise, and smoking avoidance were discussed with patient.  See orders for this visit as documented in the electronic medical record.  I discussed the assessment and treatment plan with the patient. The patient was provided an opportunity to ask questions and all were answered. The patient agreed with the plan and demonstrated an understanding of the instructions.   The patient was advised to call back or seek an in-person evaluation if the symptoms worsen or if the condition fails to improve as anticipated.   CMA or LPN served as scribe during this visit. History, Physical, and Plan performed by medical provider. The above documentation has been reviewed and is accurate and complete.  [place], [place] 03/16/2019

## 2019-03-17 LAB — NOVEL CORONAVIRUS, NAA: SARS-CoV-2, NAA: DETECTED — AB

## 2019-03-18 ENCOUNTER — Telehealth: Payer: Self-pay | Admitting: Family Medicine

## 2019-03-18 NOTE — Telephone Encounter (Signed)
See result note. Positive COVID-19 test result

## 2019-03-18 NOTE — Telephone Encounter (Signed)
Called l/m to call office  

## 2019-03-19 ENCOUNTER — Ambulatory Visit (INDEPENDENT_AMBULATORY_CARE_PROVIDER_SITE_OTHER): Payer: BC Managed Care – PPO | Admitting: Family Medicine

## 2019-03-19 ENCOUNTER — Other Ambulatory Visit: Payer: Self-pay

## 2019-03-19 ENCOUNTER — Encounter: Payer: Self-pay | Admitting: Family Medicine

## 2019-03-19 VITALS — Ht 63.0 in

## 2019-03-19 DIAGNOSIS — R059 Cough, unspecified: Secondary | ICD-10-CM

## 2019-03-19 DIAGNOSIS — R05 Cough: Secondary | ICD-10-CM

## 2019-03-19 DIAGNOSIS — Z7189 Other specified counseling: Secondary | ICD-10-CM

## 2019-03-19 DIAGNOSIS — U071 COVID-19: Secondary | ICD-10-CM

## 2019-03-19 MED ORDER — AZITHROMYCIN 250 MG PO TABS: ORAL_TABLET | ORAL | 0 refills | Status: DC

## 2019-03-19 MED ORDER — HYDROCOD POLST-CPM POLST ER 10-8 MG/5ML PO SUER: 5.0000 mL | Freq: Every evening | ORAL | 0 refills | Status: DC | PRN

## 2019-03-19 MED ORDER — PREDNISONE 5 MG PO TABS: ORAL_TABLET | ORAL | 0 refills | Status: DC

## 2019-03-19 MED ORDER — ALBUTEROL SULFATE HFA 108 (90 BASE) MCG/ACT IN AERS: 2.0000 | INHALATION_SPRAY | Freq: Four times a day (QID) | RESPIRATORY_TRACT | 0 refills | Status: DC | PRN

## 2019-03-19 NOTE — Progress Notes (Signed)
Virtual Visit via Video   Due to the COVID-19 pandemic, this visit was completed with telemedicine (audio/video) technology to reduce patient and provider exposure as well as to preserve personal protective equipment.   I connected with Brooke Clarke by a video enabled telemedicine application and verified that I am speaking with the correct person using two identifiers. Location patient: Home Location provider: [facility], Office Persons participating in the virtual visit: Brooke Clarke, [place], DO   I discussed the limitations of evaluation and management by telemedicine and the availability of in person appointments. The patient expressed understanding and agreed to proceed.  Care Team   Patient Care Team: Brooke Deutscher, DO as PCP - General (Family Medicine)  Subjective:   HPI: Patient with recent positive test for COVID.  Believes that her exposure was her daughter.  She has been quarantined in her room.  Complains of malaise and fatigue, cough, congestion, some nausea and diarrhea, and intermittent dizziness.  No shortness of breath.  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  Currently taking metformin.  Also taking Effexor and has not missed any of those doses.  Non-smoker.  Patient Active Problem List   Diagnosis Date Noted  . History of breast augmentation 09/03/2018  . Tear of lateral meniscus of knee 06/29/2018  . Tear of medial meniscus of knee 06/16/2018  . Insulin resistance 03/10/2018  . OSA on CPAP 03/10/2018  . Osteoarthritis of right knee 10/28/2017  . Spondylosis of lumbar joint 07/31/2017  . Subjective tinnitus of both ears 12/24/2016  . Chronic pain in right foot 07/23/2016  . Chronic pain in left foot 07/23/2016  . Sciatica of left side 07/19/2012  . Plantar fibromatosis 03/03/2012  . HSV-2 (herpes simplex virus 2) infection 07/19/2011  . Depression, recurrent ([place]) 01/23/2010    Social History   Tobacco Use    . Smoking status: Never Smoker  . Smokeless tobacco: Never Used  Substance Use Topics  . Alcohol use: Not Currently   Current Outpatient Medications:  .  metFORMIN (GLUCOPHAGE-XR) 750 MG 24 hr tablet, TAKE 1 TABLET(750 MG) BY MOUTH DAILY WITH BREAKFAST, Disp: 90 tablet, Rfl: 1 .  venlafaxine XR (EFFEXOR-XR) 75 MG 24 hr capsule, Take 1 capsule (75 mg total) by mouth 2 (two) times daily. (Patient taking differently: Take 75 mg by mouth daily with breakfast. ), Disp: 90 capsule, Rfl: 0  Allergies  Allergen Reactions  . Hydrocodone-Acetaminophen Itching    itchy   Objective:   VITALS: Per patient if applicable, see vitals. GENERAL: Alert, appears well and in no acute distress. HEENT: Atraumatic, conjunctiva clear, no obvious abnormalities on inspection of external nose and ears. NECK: Normal movements of the head and neck. CARDIOPULMONARY: No increased WOB. Speaking in clear sentences. I:E ratio WNL.  MS: Moves all visible extremities without noticeable abnormality. PSYCH: Pleasant and cooperative, well-groomed. Speech normal rate and rhythm. Affect is appropriate. Insight and judgement are appropriate. Attention is focused, linear, and appropriate.  NEURO: CN grossly intact. Oriented as arrived to appointment on time with no prompting. Moves both UE equally.  SKIN: No obvious lesions, wounds, erythema, or cyanosis noted on face or hands.  Depression screen Brooke Clarke 2/9 12/07/2018 09/02/2018 03/04/2018  Decreased Interest 1 2 3   Down, Depressed, Hopeless 0 2 3  PHQ - 2 Score 1 4 6   Altered sleeping 0 1 2  Tired, decreased energy 1 2 3   Change in appetite 1 2  3  Feeling bad or failure about yourself  0 2 3  Trouble concentrating 0 2 2  Moving slowly or fidgety/restless 0 1 0  Suicidal thoughts 0 0 0  PHQ-9 Score 3 14 19   Difficult doing work/chores Somewhat difficult Somewhat difficult Somewhat difficult   Assessment and Plan:   Brooke Clarke was seen today for discussion about positive test  of covid.  Diagnoses and all orders for this visit:  COVID-19 virus infection Discussion regarding quarantine and precautions.  Red flags reviewed.  I encouraged her to hold her metformin at this time and increase fluids with electrolytes.  Hold exercise for now.  Educated About Covid-19 Virus Infection  Cough Prescription was given is taking it.  Weekend is coming. -     azithromycin (ZITHROMAX) 250 MG tablet; 2 po on day one, then one po daily until gone. -     predniSONE (DELTASONE) 5 MG tablet; 6-5-4-3-2-1-off -     chlorpheniramine-HYDROcodone (TUSSIONEX PENNKINETIC ER) 10-8 MG/5ML SUER; Take 5 mLs by mouth at bedtime as needed for cough. -     albuterol (VENTOLIN HFA) 108 (90 Base) MCG/ACT inhaler; Inhale 2 puffs into the lungs every 6 (six) hours as needed for wheezing or shortness of breath.   Marland Kitchen COVID-19 Education: The signs and symptoms of COVID-19 were discussed with the patient and how to seek care for testing if needed. The importance of social distancing was discussed today. . Reviewed expectations re: course of current medical issues. . Discussed self-management of symptoms. . Outlined signs and symptoms indicating need for more acute intervention. . Patient verbalized understanding and all questions were answered. Marland Kitchen Health Maintenance issues including appropriate healthy diet, exercise, and smoking avoidance were discussed with patient. . See orders for this visit as documented in the electronic medical record.  Brooke Deutscher, DO  Records requested if needed. Time spent: 25 minutes, of which >50% was spent in obtaining information about her symptoms, reviewing her previous labs, evaluations, and treatments, counseling her about her condition (please see the discussed topics above), and developing a plan to further investigate it; she had a number of questions which I addressed.

## 2019-03-19 NOTE — Telephone Encounter (Signed)
Pt scheduled to be seen today

## 2019-04-05 ENCOUNTER — Telehealth: Payer: Self-pay | Admitting: Physical Therapy

## 2019-04-05 ENCOUNTER — Encounter: Payer: Self-pay | Admitting: Family Medicine

## 2019-04-05 NOTE — Telephone Encounter (Signed)
Yes

## 2019-04-05 NOTE — Telephone Encounter (Signed)
Repeat message. See phone note.

## 2019-04-05 NOTE — Telephone Encounter (Signed)
Copied from [place] [id number]. Topic: Quick Communication - See Telephone Encounter >> Apr 05, 2019 10:24 AM [place] wrote: CRM for notification. See Telephone encounter for: 04/05/19. PT says she needs a note to go back to work today. Was positive for Covid on the 5th. Fever broke on Friday the 14th, she wants a note, stating that she had to be quarantined and getting rest until now. Please advise [phone] or  e mail to [email or web address]

## 2019-04-05 NOTE — Telephone Encounter (Signed)
Ok to give letter

## 2019-04-05 NOTE — Telephone Encounter (Signed)
Called patient let her know that letter is done and my chart. Per patients request I have mailed copy of that.

## 2019-04-15 DIAGNOSIS — M25562 Pain in left knee: Secondary | ICD-10-CM | POA: Diagnosis not present

## 2019-04-15 DIAGNOSIS — M25561 Pain in right knee: Secondary | ICD-10-CM | POA: Diagnosis not present

## 2019-05-02 NOTE — Progress Notes (Signed)
Virtual Visit via Video   Due to the COVID-19 pandemic, this visit was completed with telemedicine (audio/video) technology to reduce patient and provider exposure as well as to preserve personal protective equipment.   I connected with Brooke Clarke by a video enabled telemedicine application and verified that I am speaking with the correct person using two identifiers. Location patient: Home Location provider: [facility], Office Persons participating in the virtual visit: Oliviah L Stafanie, [place], DO   I discussed the limitations of evaluation and management by telemedicine and the availability of in person appointments. The patient expressed understanding and agreed to proceed.  Care Team   Patient Care Team: [name], DO as PCP - General (Family Medicine)  Subjective:   HPI: Patient here for a surgical clearance.  Patient explains that she has stopped taking the Metformin 750MG  until clearance from [name].  Patient tested positive for COVID-19 virus 03/16/2019. Feeling well. Surgery: Left TKR. [facility].   Review of Systems  Constitutional: Negative for chills, fever, malaise/fatigue and weight loss.  Respiratory: Negative for cough, shortness of breath and wheezing.   Cardiovascular: Negative for chest pain, palpitations and leg swelling.  Gastrointestinal: Negative for abdominal pain, constipation, diarrhea, nausea and vomiting.  Genitourinary: Negative for dysuria and urgency.  Musculoskeletal: Negative for joint pain and myalgias.  Skin: Negative for rash.  Neurological: Negative for dizziness and headaches.  Psychiatric/Behavioral: Negative for depression, substance abuse and suicidal ideas. The patient is not nervous/anxious.     Patient Active Problem List   Diagnosis Date Noted  . History of 2019 novel coronavirus disease (COVID-19), 03/16/19 05/03/2019  . History of breast augmentation 09/03/2018  . Tear of lateral meniscus of knee 06/29/2018  .  Tear of medial meniscus of knee 06/16/2018  . Insulin resistance 03/10/2018  . OSA on CPAP 03/10/2018  . Osteoarthritis of right knee 10/28/2017  . Spondylosis of lumbar joint 07/31/2017  . Subjective tinnitus of both ears 12/24/2016  . Chronic pain in right foot 07/23/2016  . Chronic pain in left foot 07/23/2016  . Sciatica of left side 07/19/2012  . Plantar fibromatosis 03/03/2012  . HSV-2 (herpes simplex virus 2) infection 07/19/2011  . Depression, recurrent ([place]) 01/23/2010    Social History   Tobacco Use  . Smoking status: Never Smoker  . Smokeless tobacco: Never Used  Substance Use Topics  . Alcohol use: Not Currently   Current Outpatient Medications:  .  albuterol (VENTOLIN HFA) 108 (90 Base) MCG/ACT inhaler, Inhale 2 puffs into the lungs every 6 (six) hours as needed for wheezing or shortness of breath., Disp: 18 g, Rfl: 0 .  venlafaxine XR (EFFEXOR-XR) 37.5 MG 24 hr capsule, Take 1 capsule by mouth BID., Disp: , Rfl:   Allergies  Allergen Reactions  . Hydrocodone-Acetaminophen Itching   Objective:   VITALS: Per patient if applicable, see vitals. GENERAL: Alert, appears well and in no acute distress. HEENT: Atraumatic, conjunctiva clear, no obvious abnormalities on inspection of external nose and ears. NECK: Normal movements of the head and neck. CARDIOPULMONARY: No increased WOB. Speaking in clear sentences. I:E ratio WNL.  MS: Moves all visible extremities without noticeable abnormality. PSYCH: Pleasant and cooperative, well-groomed. Speech normal rate and rhythm. Affect is appropriate. Insight and judgement are appropriate. Attention is focused, linear, and appropriate.  NEURO: CN grossly intact. Oriented as arrived to appointment on time with no prompting. Moves both UE equally.  SKIN: No obvious lesions, wounds, erythema, or cyanosis noted on face or  hands.  Depression screen [facility] 2/9 05/03/2019 12/07/2018 09/02/2018  Decreased Interest 0 1 2  Down, Depressed,  Hopeless 0 0 2  PHQ - 2 Score 0 1 4  Altered sleeping 0 0 1  Tired, decreased energy 0 1 2  Change in appetite 0 1 2  Feeling bad or failure about yourself  0 0 2  Trouble concentrating 0 0 2  Moving slowly or fidgety/restless 0 0 1  Suicidal thoughts 0 0 0  PHQ-9 Score 0 3 14  Difficult doing work/chores Not difficult at all Somewhat difficult Somewhat difficult    Assessment and Plan:   Brooke Clarke was seen today for surgical clearance.  Diagnoses and all orders for this visit:  History of 2019 novel coronavirus disease (COVID-19), 03/16/19  Preop examination  Insulin resistance -     Comprehensive metabolic panel; Future -     Hemoglobin A1c; Future  OSA on CPAP -     CBC with Differential/Platelet; Future  Needs flu shot  Primary osteoarthritis of right knee  Screening for lipid disorders -     Lipid panel; Future  Vitamin D deficiency -     Cancel: TSH; Future -     VITAMIN D 25 Hydroxy (Vit-D Deficiency, Fractures); Future    . COVID-19 Education: The signs and symptoms of COVID-19 were discussed with the patient and how to seek care for testing if needed. The importance of social distancing was discussed today. . Reviewed expectations re: course of current medical issues. . Discussed self-management of symptoms. . Outlined signs and symptoms indicating need for more acute intervention. . Patient verbalized understanding and all questions were answered. Marland Kitchen Health Maintenance issues including appropriate healthy diet, exercise, and smoking avoidance were discussed with patient. . See orders for this visit as documented in the electronic medical record.  [name], DO  Records requested if needed. Time spent: 25 minutes, of which >50% was spent in obtaining information about her symptoms, reviewing her previous labs, evaluations, and treatments, counseling her about her condition (please see the discussed topics above), and developing a plan to further investigate  it; she had a number of questions which I addressed.

## 2019-05-03 ENCOUNTER — Encounter: Payer: Self-pay | Admitting: Family Medicine

## 2019-05-03 ENCOUNTER — Ambulatory Visit (INDEPENDENT_AMBULATORY_CARE_PROVIDER_SITE_OTHER): Payer: BC Managed Care – PPO | Admitting: Family Medicine

## 2019-05-03 VITALS — Ht 63.0 in | Wt 200.0 lb

## 2019-05-03 DIAGNOSIS — Z23 Encounter for immunization: Secondary | ICD-10-CM

## 2019-05-03 DIAGNOSIS — M1711 Unilateral primary osteoarthritis, right knee: Secondary | ICD-10-CM

## 2019-05-03 DIAGNOSIS — G4733 Obstructive sleep apnea (adult) (pediatric): Secondary | ICD-10-CM | POA: Diagnosis not present

## 2019-05-03 DIAGNOSIS — Z8616 Personal history of COVID-19: Secondary | ICD-10-CM

## 2019-05-03 DIAGNOSIS — Z01818 Encounter for other preprocedural examination: Secondary | ICD-10-CM | POA: Diagnosis not present

## 2019-05-03 DIAGNOSIS — E8881 Metabolic syndrome: Secondary | ICD-10-CM | POA: Diagnosis not present

## 2019-05-03 DIAGNOSIS — Z8619 Personal history of other infectious and parasitic diseases: Secondary | ICD-10-CM | POA: Diagnosis not present

## 2019-05-03 DIAGNOSIS — Z9989 Dependence on other enabling machines and devices: Secondary | ICD-10-CM

## 2019-05-03 DIAGNOSIS — E88819 Insulin resistance, unspecified: Secondary | ICD-10-CM

## 2019-05-03 DIAGNOSIS — Z13220 Encounter for screening for lipoid disorders: Secondary | ICD-10-CM

## 2019-05-03 DIAGNOSIS — E559 Vitamin D deficiency, unspecified: Secondary | ICD-10-CM

## 2019-05-03 MED ORDER — VENLAFAXINE HCL ER 75 MG PO CP24: 75.0000 mg | ORAL_CAPSULE | Freq: Every day | ORAL | 3 refills | Status: DC

## 2019-05-03 NOTE — Addendum Note (Signed)
Addended by: [name] R on: 05/03/2019 09:07 AM   Modules accepted: Orders

## 2019-05-05 DIAGNOSIS — G4733 Obstructive sleep apnea (adult) (pediatric): Secondary | ICD-10-CM | POA: Diagnosis not present

## 2019-05-12 ENCOUNTER — Ambulatory Visit (INDEPENDENT_AMBULATORY_CARE_PROVIDER_SITE_OTHER): Payer: BC Managed Care – PPO

## 2019-05-12 ENCOUNTER — Other Ambulatory Visit: Payer: Self-pay

## 2019-05-12 ENCOUNTER — Ambulatory Visit (INDEPENDENT_AMBULATORY_CARE_PROVIDER_SITE_OTHER): Payer: BC Managed Care – PPO | Admitting: Family Medicine

## 2019-05-12 ENCOUNTER — Encounter: Payer: Self-pay | Admitting: Family Medicine

## 2019-05-12 ENCOUNTER — Ambulatory Visit (INDEPENDENT_AMBULATORY_CARE_PROVIDER_SITE_OTHER)
Admission: RE | Admit: 2019-05-12 | Discharge: 2019-05-12 | Disposition: A | Discharge status: Home / Self Care | Payer: BC Managed Care – PPO | Source: Ambulatory Visit | Attending: Family Medicine | Admitting: Family Medicine

## 2019-05-12 VITALS — BP 126/82 | HR 78 | Temp 98.6°F | Resp 16 | Ht 63.0 in | Wt 239.0 lb

## 2019-05-12 DIAGNOSIS — R5383 Other fatigue: Secondary | ICD-10-CM

## 2019-05-12 DIAGNOSIS — Z13220 Encounter for screening for lipoid disorders: Secondary | ICD-10-CM | POA: Diagnosis not present

## 2019-05-12 DIAGNOSIS — R9389 Abnormal findings on diagnostic imaging of other specified body structures: Secondary | ICD-10-CM

## 2019-05-12 DIAGNOSIS — E7800 Pure hypercholesterolemia, unspecified: Secondary | ICD-10-CM

## 2019-05-12 DIAGNOSIS — G4733 Obstructive sleep apnea (adult) (pediatric): Secondary | ICD-10-CM

## 2019-05-12 DIAGNOSIS — Z20828 Contact with and (suspected) exposure to other viral communicable diseases: Secondary | ICD-10-CM | POA: Diagnosis not present

## 2019-05-12 DIAGNOSIS — Z9989 Dependence on other enabling machines and devices: Secondary | ICD-10-CM

## 2019-05-12 DIAGNOSIS — R5381 Other malaise: Secondary | ICD-10-CM | POA: Diagnosis not present

## 2019-05-12 DIAGNOSIS — E559 Vitamin D deficiency, unspecified: Secondary | ICD-10-CM | POA: Diagnosis not present

## 2019-05-12 DIAGNOSIS — E8881 Metabolic syndrome: Secondary | ICD-10-CM | POA: Diagnosis not present

## 2019-05-12 DIAGNOSIS — Z01818 Encounter for other preprocedural examination: Secondary | ICD-10-CM | POA: Diagnosis not present

## 2019-05-12 LAB — COMPREHENSIVE METABOLIC PANEL
ALT: 17 U/L (ref 0–35)
AST: 14 U/L (ref 0–37)
Albumin: 4.2 g/dL (ref 3.5–5.2)
Alkaline Phosphatase: 79 U/L (ref 39–117)
BUN: 14 mg/dL (ref 6–23)
CO2: 28 mEq/L (ref 19–32)
Calcium: 9.9 mg/dL (ref 8.4–10.5)
Chloride: 104 mEq/L (ref 96–112)
Creatinine, Ser: 0.78 mg/dL (ref 0.40–1.20)
GFR: 75.28 mL/min (ref >60.00)
Glucose, Bld: 108 mg/dL — ABNORMAL HIGH (ref 70–99)
Potassium: 4.8 mEq/L (ref 3.5–5.1)
Sodium: 139 mEq/L (ref 135–145)
Total Bilirubin: 0.4 mg/dL (ref 0.2–1.2)
Total Protein: 6.6 g/dL (ref 6.0–8.3)

## 2019-05-12 LAB — CBC WITH DIFFERENTIAL/PLATELET
Basophils Absolute: 0 10*3/uL (ref 0.0–0.1)
Basophils Relative: 0.5 % (ref 0.0–3.0)
Eosinophils Absolute: 0.2 10*3/uL (ref 0.0–0.7)
Eosinophils Relative: 2.2 % (ref 0.0–5.0)
HCT: 43.2 % (ref 36.0–46.0)
Hemoglobin: 14.3 g/dL (ref 12.0–15.0)
Lymphocytes Relative: 36.2 % (ref 12.0–46.0)
Lymphs Abs: 2.5 10*3/uL (ref 0.7–4.0)
MCHC: 33.1 g/dL (ref 30.0–36.0)
MCV: 90.8 fl (ref 78.0–100.0)
Monocytes Absolute: 0.4 10*3/uL (ref 0.1–1.0)
Monocytes Relative: 6.1 % (ref 3.0–12.0)
Neutro Abs: 3.7 10*3/uL (ref 1.4–7.7)
Neutrophils Relative %: 55 % (ref 43.0–77.0)
Platelets: 195 10*3/uL (ref 150.0–400.0)
RBC: 4.76 Mil/uL (ref 3.87–5.11)
RDW: 14.3 % (ref 11.5–15.5)
WBC: 6.8 10*3/uL (ref 4.0–10.5)

## 2019-05-12 LAB — LIPID PANEL
Cholesterol: 241 mg/dL — ABNORMAL HIGH (ref 0–200)
HDL: 43.7 mg/dL (ref >39.00)
NonHDL: 197.22
Total CHOL/HDL Ratio: 6
Triglycerides: 217 mg/dL — ABNORMAL HIGH (ref 0.0–149.0)
VLDL: 43.4 mg/dL — ABNORMAL HIGH (ref 0.0–40.0)

## 2019-05-12 LAB — VITAMIN D 25 HYDROXY (VIT D DEFICIENCY, FRACTURES): VITD: 21.79 ng/mL — ABNORMAL LOW (ref 30.00–100.00)

## 2019-05-12 LAB — TSH: TSH: 2.67 u[IU]/mL (ref 0.35–4.50)

## 2019-05-12 LAB — HEMOGLOBIN A1C: Hgb A1c MFr Bld: 6 % (ref 4.6–6.5)

## 2019-05-12 LAB — LDL CHOLESTEROL, DIRECT: Direct LDL: 165 mg/dL

## 2019-05-12 NOTE — Patient Instructions (Addendum)
Health Maintenance Due  Topic Date Due  . TETANUS/TDAP  02/16/1978  . MAMMOGRAM  07/25/2018  . PAP SMEAR-Modifier  08/25/2018  . INFLUENZA VACCINE  03/13/2019    Depression screen [facility] 2/9 05/03/2019 12/07/2018 09/02/2018  Decreased Interest 0 1 2  Down, Depressed, Hopeless 0 0 2  PHQ - 2 Score 0 1 4  Altered sleeping 0 0 1  Tired, decreased energy 0 1 2  Change in appetite 0 1 2  Feeling bad or failure about yourself  0 0 2  Trouble concentrating 0 0 2  Moving slowly or fidgety/restless 0 0 1  Suicidal thoughts 0 0 0  PHQ-9 Score 0 3 14  Difficult doing work/chores Not difficult at all Somewhat difficult Somewhat difficult

## 2019-05-12 NOTE — Progress Notes (Signed)
Brooke Clarke is a 58 y.o. female is here for follow up.  History of Present Illness:   Brooke Clarke, CMA acting as scribe for Dr. [name].   HPI: Preoperative testing. See previous note. Patient endorses feeling heart flutters and SOB at times since COVID infection. Still fatigued. Anxious.   Health Maintenance Due  Topic Date Due  . TETANUS/TDAP  02/16/1978  . MAMMOGRAM  07/25/2018  . PAP SMEAR-Modifier  08/25/2018  . INFLUENZA VACCINE  03/13/2019   Depression screen [facility] 2/9 05/12/2019 05/03/2019 12/07/2018  Decreased Interest 1 0 1  Down, Depressed, Hopeless 1 0 0  PHQ - 2 Score 2 0 1  Altered sleeping 0 0 0  Tired, decreased energy 2 0 1  Change in appetite 1 0 1  Feeling bad or failure about yourself  0 0 0  Trouble concentrating 0 0 0  Moving slowly or fidgety/restless 0 0 0  Suicidal thoughts 0 0 0  PHQ-9 Score 5 0 3  Difficult doing work/chores Not difficult at all Not difficult at all Somewhat difficult   PMHx, SurgHx, SocialHx, FamHx, Medications, and Allergies were reviewed in the Visit Navigator and updated as appropriate.   Patient Active Problem List   Diagnosis Date Noted  . History of 2019 novel coronavirus disease (COVID-19), 03/16/19 05/03/2019  . History of breast augmentation 09/03/2018  . Tear of lateral meniscus of knee 06/29/2018  . Tear of medial meniscus of knee 06/16/2018  . Insulin resistance 03/10/2018  . OSA on CPAP 03/10/2018  . Osteoarthritis of right knee 10/28/2017  . Spondylosis of lumbar joint 07/31/2017  . Subjective tinnitus of both ears 12/24/2016  . Chronic pain in right foot 07/23/2016  . Chronic pain in left foot 07/23/2016  . Sciatica of left side 07/19/2012  . Plantar fibromatosis 03/03/2012  . HSV-2 (herpes simplex virus 2) infection 07/19/2011  . Depression, recurrent ([place]) 01/23/2010   Social History   Tobacco Use  . Smoking status: Never Smoker  . Smokeless tobacco: Never Used  Substance Use Topics  .  Alcohol use: Not Currently  . Drug use: Never   Current Medications and Allergies   .  albuterol (VENTOLIN HFA) 108 (90 Base) MCG/ACT inhaler, Inhale 2 puffs into the lungs every 6 (six) hours as needed for wheezing or shortness of breath., Disp: 18 g, Rfl: 0 .  venlafaxine XR (EFFEXOR XR) 75 MG 24 hr capsule, Take 1 capsule (75 mg total) by mouth daily with breakfast., Disp: 90 capsule, Rfl: 3   Allergies  Allergen Reactions  . Hydrocodone-Acetaminophen Itching   Review of Systems   Pertinent items are noted in the HPI. Otherwise, a complete ROS is negative.  Vitals   Vitals:   05/12/19 0928  BP: 126/82  Pulse: 78  Resp: 16  Temp: 98.6 F (37 C)  TempSrc: Temporal  Weight: 239 lb (108.4 kg)  Height: 5\' 3"  (1.6 m)     Body mass index is 42.34 kg/m.  Physical Exam   Physical Exam Vitals signs and nursing note reviewed.  HENT:     Head: Normocephalic and atraumatic.  Eyes:     Pupils: Pupils are equal, round, and reactive to light.  Neck:     Musculoskeletal: Normal range of motion and neck supple.  Cardiovascular:     Rate and Rhythm: Normal rate and regular rhythm.     Heart sounds: Normal heart sounds.  Pulmonary:     Effort: Pulmonary effort is normal.  Abdominal:  Palpations: Abdomen is soft.  Skin:    General: Skin is warm.  Psychiatric:        Behavior: Behavior normal.    Assessment and Plan   Brooke Clarke was seen today for anxiety and pre-op exam.  Diagnoses and all orders for this visit:  Preoperative clearance -     EKG 12-Lead -     DG Chest 2 View; Future -     Ambulatory referral to Cardiology  Vitamin D deficiency -     VITAMIN D 25 Hydroxy (Vit-D Deficiency, Fractures)  Screening for lipid disorders  Insulin resistance -     Hemoglobin A1c -     Comprehensive metabolic panel  OSA on CPAP  Abnormal chest x-ray -     CT Chest Wo Contrast; Future  Malaise and fatigue -     TSH -     Comprehensive metabolic panel -     CBC  with Differential/Platelet  Pure hypercholesterolemia -     Lipid panel -     LDL cholesterol, direct   . Orders and follow up as documented in [place], reviewed diet, exercise and weight control, cardiovascular risk and specific lipid/LDL goals reviewed, reviewed medications and side effects in detail.  . Reviewed expectations re: course of current medical issues. . Outlined signs and symptoms indicating need for more acute intervention. . Patient verbalized understanding and all questions were answered. . Patient received an After Visit Summary.  [name], DO [place] [place] 05/13/2019

## 2019-05-13 ENCOUNTER — Encounter: Payer: Self-pay | Admitting: Family Medicine

## 2019-05-18 ENCOUNTER — Encounter: Payer: Self-pay | Admitting: Family Medicine

## 2019-05-18 ENCOUNTER — Telehealth: Payer: Self-pay | Admitting: Family Medicine

## 2019-05-18 NOTE — Telephone Encounter (Signed)
Copied from [place] [id number]. Topic: General - Other >> May 18, 2019 10:18 AM Leward Quan A wrote: Reason for CRM: Patient called to ask if the results from her CT scan can be sent to Dr Dessie Coma Phone: [phone]. Fax: [phone] ...askin that the report and films if available are faxed over for appointment tomorrow morning.

## 2019-05-19 NOTE — Telephone Encounter (Signed)
See other open my chart for documentation.

## 2019-05-21 NOTE — Telephone Encounter (Signed)
See other open messages

## 2019-05-24 ENCOUNTER — Encounter: Payer: Self-pay | Admitting: Family Medicine

## 2019-05-25 ENCOUNTER — Encounter: Payer: Self-pay | Admitting: Family Medicine

## 2019-05-25 NOTE — Telephone Encounter (Signed)
Addressed in result note.  

## 2019-05-26 ENCOUNTER — Encounter: Payer: Self-pay | Admitting: Family Medicine

## 2019-05-26 ENCOUNTER — Other Ambulatory Visit: Payer: Self-pay

## 2019-05-26 ENCOUNTER — Ambulatory Visit (INDEPENDENT_AMBULATORY_CARE_PROVIDER_SITE_OTHER): Payer: BC Managed Care – PPO

## 2019-05-26 DIAGNOSIS — Z23 Encounter for immunization: Secondary | ICD-10-CM | POA: Diagnosis not present

## 2019-05-26 NOTE — Telephone Encounter (Signed)
Added from second my chart:   I have a question about TSH resulted on 05/12/19, 3:15 PM.  I went to the pharmacy and no medication has been called in. I need the vitamin d ...do I start metformin again? You took me off that when I got sick with Covid.Marland KitchenMarland KitchenI need something for high blood sugar...high cholesterol or weight control and I need a referral to see you at weight center

## 2019-05-26 NOTE — Telephone Encounter (Signed)
Added to open message.  

## 2019-05-27 ENCOUNTER — Other Ambulatory Visit: Payer: Self-pay

## 2019-05-27 ENCOUNTER — Encounter: Payer: Self-pay | Admitting: Family Medicine

## 2019-05-27 MED ORDER — RYBELSUS 7 MG PO TABS: 7.0000 mg | ORAL_TABLET | Freq: Every day | ORAL | 4 refills | Status: DC

## 2019-05-27 MED ORDER — VITAMIN D (ERGOCALCIFEROL) 1.25 MG (50000 UNIT) PO CAPS: 50000.0000 [IU] | ORAL_CAPSULE | ORAL | 0 refills | Status: DC

## 2019-05-27 NOTE — Telephone Encounter (Signed)
See note  Copied from [place] [id number]. Topic: General - Other >> May 27, 2019 11:53 AM Keene Breath wrote: Reason for CRM: Patient called to ask Joellen to call her regarding some prescriptions.  CB# [phone]

## 2019-05-27 NOTE — Telephone Encounter (Signed)
Duplicate

## 2019-05-28 MED ORDER — CHOLECALCIFEROL 1.25 MG (50000 UT) PO TABS: ORAL_TABLET | ORAL | 0 refills | Status: DC

## 2019-06-10 ENCOUNTER — Other Ambulatory Visit: Payer: Self-pay

## 2019-06-10 ENCOUNTER — Ambulatory Visit (INDEPENDENT_AMBULATORY_CARE_PROVIDER_SITE_OTHER): Payer: BC Managed Care – PPO | Admitting: Cardiology

## 2019-06-10 ENCOUNTER — Encounter: Payer: Self-pay | Admitting: Cardiology

## 2019-06-10 VITALS — BP 132/88 | HR 81 | Ht 63.0 in | Wt 238.0 lb

## 2019-06-10 DIAGNOSIS — Z01818 Encounter for other preprocedural examination: Secondary | ICD-10-CM | POA: Diagnosis not present

## 2019-06-10 DIAGNOSIS — E782 Mixed hyperlipidemia: Secondary | ICD-10-CM

## 2019-06-10 DIAGNOSIS — E785 Hyperlipidemia, unspecified: Secondary | ICD-10-CM | POA: Insufficient documentation

## 2019-06-10 DIAGNOSIS — Z01810 Encounter for preprocedural cardiovascular examination: Secondary | ICD-10-CM

## 2019-06-10 NOTE — Progress Notes (Signed)
Cardiology Office Note:    Date:  06/10/2019   ID:  Brooke Clarke, DOB Aug 09, 1961, MRN [id number]  PCP:  [name], DO  Cardiologist:  Jenean Lindau, MD   Referring MD: [name], DO    ASSESSMENT:    1. Pre-op evaluation   2. Preoperative cardiovascular examination   3. Morbid obesity ([place])   4. Mixed dyslipidemia    PLAN:    In order of problems listed above:  1. Preoperative cardiovascular stratification: Patient has risk factors for coronary artery disease.  She leads a very sedentary lifestyle.  In view of this I have recommended Lexiscan sestamibi and she is agreeable.  If the test is negative he is not at high risk for coronary events. 2. Mixed dyslipidemia: She has been initiated on statin therapy by her primary care physician.  Diet was reemphasized.  Risks of obesity explained and weight reduction was stressed she promises to do better. 3. Patient will be seen in follow-up appointment in 6 months or earlier if the patient has any concerns    Medication Adjustments/Labs and Tests Ordered: Current medicines are reviewed at length with the patient today.  Concerns regarding medicines are outlined above.  Orders Placed This Encounter  Procedures   MYOCARDIAL PERFUSION IMAGING   No orders of the defined types were placed in this encounter.    History of Present Illness:    Brooke Clarke is a 58 y.o. female who is being seen today for the evaluation of preoperative cardiovascular evaluation at the request of [name], DO.  Patient is a pleasant 58 year old female.  She is contemplating knee replacement surgery.  She has history of sleep apnea and significant dyslipidemia.  She leads a sedentary lifestyle because of orthopedic issues involving her knee.  She denies any chest pain orthopnea or PND.  At the time of my evaluation, the patient is alert awake oriented and in no distress.  She denies any chest discomfort but does have some shortness of breath  on exertion.  Past Medical History:  Diagnosis Date   Sleep apnea     Past Surgical History:  Procedure Laterality Date   CHOLECYSTECTOMY     FOOT SURGERY     KNEE SURGERY      Current Medications: Current Meds  Medication Sig   Cholecalciferol 1.25 MG (50000 UT) TABS 50,000 units PO qwk for 12 weeks.   Semaglutide (RYBELSUS) 7 MG TABS Take 7 mg by mouth daily.   venlafaxine XR (EFFEXOR XR) 75 MG 24 hr capsule Take 1 capsule (75 mg total) by mouth daily with breakfast.   Vitamin D, Ergocalciferol, (DRISDOL) 1.25 MG (50000 UT) CAPS capsule Take 1 capsule (50,000 Units total) by mouth every 7 (seven) days.     Allergies:   Hydrocodone-acetaminophen   Social History   Socioeconomic History   Marital status: Single    Spouse name: Not on file   Number of children: Not on file   Years of education: Not on file   Highest education level: Not on file  Occupational History   Not on file  Social Needs   Financial resource strain: Not on file   Food insecurity    Worry: Not on file    Inability: Not on file   Transportation needs    Medical: Not on file    Non-medical: Not on file  Tobacco Use   Smoking status: Never Smoker   Smokeless tobacco: Never Used  Substance and Sexual Activity  Alcohol use: Not Currently   Drug use: Never   Sexual activity: Not on file  Lifestyle   Physical activity    Days per week: Not on file    Minutes per session: Not on file   Stress: Not on file  Relationships   Social connections    Talks on phone: Not on file    Gets together: Not on file    Attends religious service: Not on file    Active member of club or organization: Not on file    Attends meetings of clubs or organizations: Not on file    Relationship status: Not on file  Other Topics Concern   Not on file  Social History Narrative   Not on file     Family History: The patient's family history includes Diabetes in her father; Stroke in an  other family member.  ROS:   Please see the history of present illness.    All other systems reviewed and are negative.  EKGs/Labs/Other Studies Reviewed:    The following studies were reviewed today: EKG reveals sinus rhythm and poor anterior forces   Recent Labs: 05/12/2019: ALT 17; BUN 14; Creatinine, Ser 0.78; Hemoglobin 14.3; Platelets 195.0; Potassium 4.8; Sodium 139; TSH 2.67  Recent Lipid Panel    Component Value Date/Time   CHOL 241 (H) 05/12/2019 0954   TRIG 217.0 (H) 05/12/2019 0954   HDL 43.70 05/12/2019 0954   CHOLHDL 6 05/12/2019 0954   VLDL 43.4 (H) 05/12/2019 0954   LDLCALC 99 10/17/2016   LDLDIRECT 165.0 05/12/2019 0954    Physical Exam:    VS:  BP 132/88 (BP Location: Left Arm, Patient Position: Sitting, Cuff Size: Normal)    Pulse 81    Ht 5\' 3"  (1.6 m)    Wt 238 lb (108 kg)    SpO2 95%    BMI 42.16 kg/m     Wt Readings from Last 3 Encounters:  06/10/19 238 lb (108 kg)  05/12/19 239 lb (108.4 kg)  05/03/19 200 lb (90.7 kg)     GEN: Patient is in no acute distress HEENT: Normal NECK: No JVD; No carotid bruits LYMPHATICS: No lymphadenopathy CARDIAC: S1 S2 regular, 2/6 systolic murmur at the apex. RESPIRATORY:  Clear to auscultation without rales, wheezing or rhonchi  ABDOMEN: Soft, non-tender, non-distended MUSCULOSKELETAL:  No edema; No deformity  SKIN: Warm and dry NEUROLOGIC:  Alert and oriented x 3 PSYCHIATRIC:  Normal affect    Signed, Jenean Lindau, MD  06/10/2019 11:48 AM    [place]

## 2019-06-10 NOTE — Patient Instructions (Addendum)
Medication Instructions:  Your physician recommends that you continue on your current medications as directed. Please refer to the Current Medication list given to you today.  If you need a refill on your cardiac medications before your next appointment, please call your pharmacy.   Lab work: NONE If you have labs (blood work) drawn today and your tests are completely normal, you will receive your results only by: . MyChart Message (if you have MyChart) OR . A paper copy in the mail If you have any lab test that is abnormal or we need to change your treatment, we will call you to review the results.  Testing/Procedures: Your physician has requested that you have a lexiscan myoview. For further information please visit [email or web address]. Please follow instruction sheet, as given.   Follow-Up: At [facility], you and your health needs are our priority.  As part of our continuing mission to provide you with exceptional heart care, we have created designated Provider Care Teams.  These Care Teams include your primary Cardiologist (physician) and Advanced Practice Providers (APPs -  Physician Assistants and Nurse Practitioners) who all work together to provide you with the care you need, when you need it. You will need a follow up appointment in 6 months.   Any Other Special Instructions Will Be Listed Below Regadenoson injection What is this medicine? REGADENOSON is used to test the heart for coronary artery disease. It is used in patients who can not exercise for their stress test. This medicine may be used for other purposes; ask your health care provider or pharmacist if you have questions. COMMON BRAND NAME(S): Lexiscan What should I tell my health care provider before I take this medicine? They need to know if you have any of these conditions:  heart problems  lung or breathing disease, like asthma or COPD  an unusual or allergic reaction to regadenoson, other medicines, foods,  dyes, or preservatives  pregnant or trying to get pregnant  breast-feeding How should I use this medicine? This medicine is for injection into a vein. It is given by a health care professional in a hospital or clinic setting. Talk to your pediatrician regarding the use of this medicine in children. Special care may be needed. Overdosage: If you think you have taken too much of this medicine contact a poison control center or emergency room at once. NOTE: This medicine is only for you. Do not share this medicine with others. What if I miss a dose? This does not apply. What may interact with this medicine?  caffeine  dipyridamole  guarana  theophylline This list may not describe all possible interactions. Give your health care provider a list of all the medicines, herbs, non-prescription drugs, or dietary supplements you use. Also tell them if you smoke, drink alcohol, or use illegal drugs. Some items may interact with your medicine. What should I watch for while using this medicine? Your condition will be monitored carefully while you are receiving this medicine. Do not take medicines, foods, or drinks with caffeine (like coffee, tea, or colas) for at least 12 hours before your test. If you do not know if something contains caffeine, ask your health care professional. What side effects may I notice from receiving this medicine? Side effects that you should report to your doctor or health care professional as soon as possible:  allergic reactions like skin rash, itching or hives, swelling of the face, lips, or tongue  breathing problems  chest pain, tightness or palpitations  severe   headache Side effects that usually do not require medical attention (report to your doctor or health care professional if they continue or are bothersome):  flushing  headache  irritation or pain at site where injected  nausea, vomiting This list may not describe all possible side effects. Call  your doctor for medical advice about side effects. You may report side effects to FDA at 1-800-FDA-1088. Where should I keep my medicine? This drug is given in a hospital or clinic and will not be stored at home. NOTE: This sheet is a summary. It may not cover all possible information. If you have questions about this medicine, talk to your doctor, pharmacist, or health care provider.  2020 Elsevier/Gold Standard (2008-03-28 15:08:13)  Cardiac Nuclear Scan A cardiac nuclear scan is a test that is done to check the flow of blood to your heart. It is done when you are resting and when you are exercising. The test looks for problems such as:  Not enough blood reaching a portion of the heart.  The heart muscle not working as it should. You may need this test if:  You have heart disease.  You have had lab results that are not normal.  You have had heart surgery or a balloon procedure to open up blocked arteries (angioplasty).  You have chest pain.  You have shortness of breath. In this test, a special dye (tracer) is put into your bloodstream. The tracer will travel to your heart. A camera will then take pictures of your heart to see how the tracer moves through your heart. This test is usually done at a hospital and takes 2-4 hours. Tell a doctor about:  Any allergies you have.  All medicines you are taking, including vitamins, herbs, eye drops, creams, and over-the-counter medicines.  Any problems you or family members have had with anesthetic medicines.  Any blood disorders you have.  Any surgeries you have had.  Any medical conditions you have.  Whether you are pregnant or may be pregnant. What are the risks? Generally, this is a safe test. However, problems may occur, such as:  Serious chest pain and heart attack. This is only a risk if the stress portion of the test is done.  Rapid heartbeat.  A feeling of warmth in your chest. This feeling usually does not last long.   Allergic reaction to the tracer. What happens before the test?  Ask your doctor about changing or stopping your normal medicines. This is important.  Follow instructions from your doctor about what you cannot eat or drink.  Remove your jewelry on the day of the test. What happens during the test?  An IV tube will be inserted into one of your veins.  Your doctor will give you a small amount of tracer through the IV tube.  You will wait for 20-40 minutes while the tracer moves through your bloodstream.  Your heart will be monitored with an electrocardiogram (ECG).  You will lie down on an exam table.  Pictures of your heart will be taken for about 15-20 minutes.  You may also have a stress test. For this test, one of these things may be done: ? You will be asked to exercise on a treadmill or a stationary bike. ? You will be given medicines that will make your heart work harder. This is done if you are unable to exercise.  When blood flow to your heart has peaked, a tracer will again be given through the IV tube.    After 20-40 minutes, you will get back on the exam table. More pictures will be taken of your heart.  Depending on the tracer that is used, more pictures may need to be taken 3-4 hours later.  Your IV tube will be removed when the test is over. The test may vary among doctors and hospitals. What happens after the test?  Ask your doctor: ? Whether you can return to your normal schedule, including diet, activities, and medicines. ? Whether you should drink more fluids. This will help to remove the tracer from your body. Drink enough fluid to keep your pee (urine) pale yellow.  Ask your doctor, or the department that is doing the test: ? When will my results be ready? ? How will I get my results? Summary  A cardiac nuclear scan is a test that is done to check the flow of blood to your heart.  Tell your doctor whether you are pregnant or may be pregnant.  Before  the test, ask your doctor about changing or stopping your normal medicines. This is important.  Ask your doctor whether you can return to your normal activities. You may be asked to drink more fluids. This information is not intended to replace advice given to you by your health care provider. Make sure you discuss any questions you have with your health care provider. Document Released: 01/12/2018 Document Revised: 11/18/2018 Document Reviewed: 01/12/2018 Elsevier Patient Education  2020 Elsevier Inc.   

## 2019-06-12 IMAGING — DX DG LUMBAR SPINE 2-3V
3 series · 3 of 3 positions shown · non-contrast
Comparison: None.

CLINICAL DATA: Chronic low back and left hip pain.

EXAM:
LUMBAR SPINE - 2-3 VIEW

[lumbar spine ap]
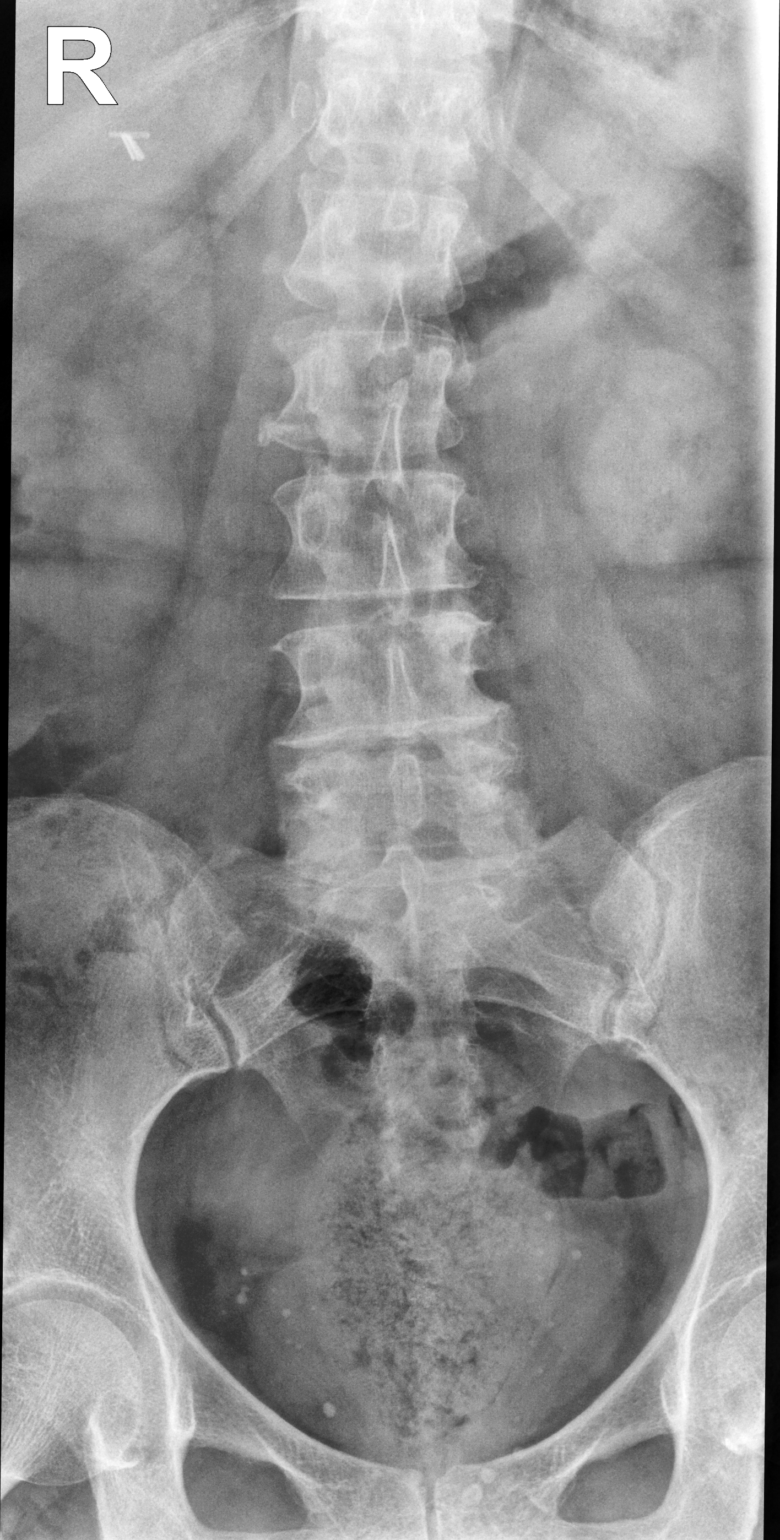

[lumbar spine lat (1 of 2)]
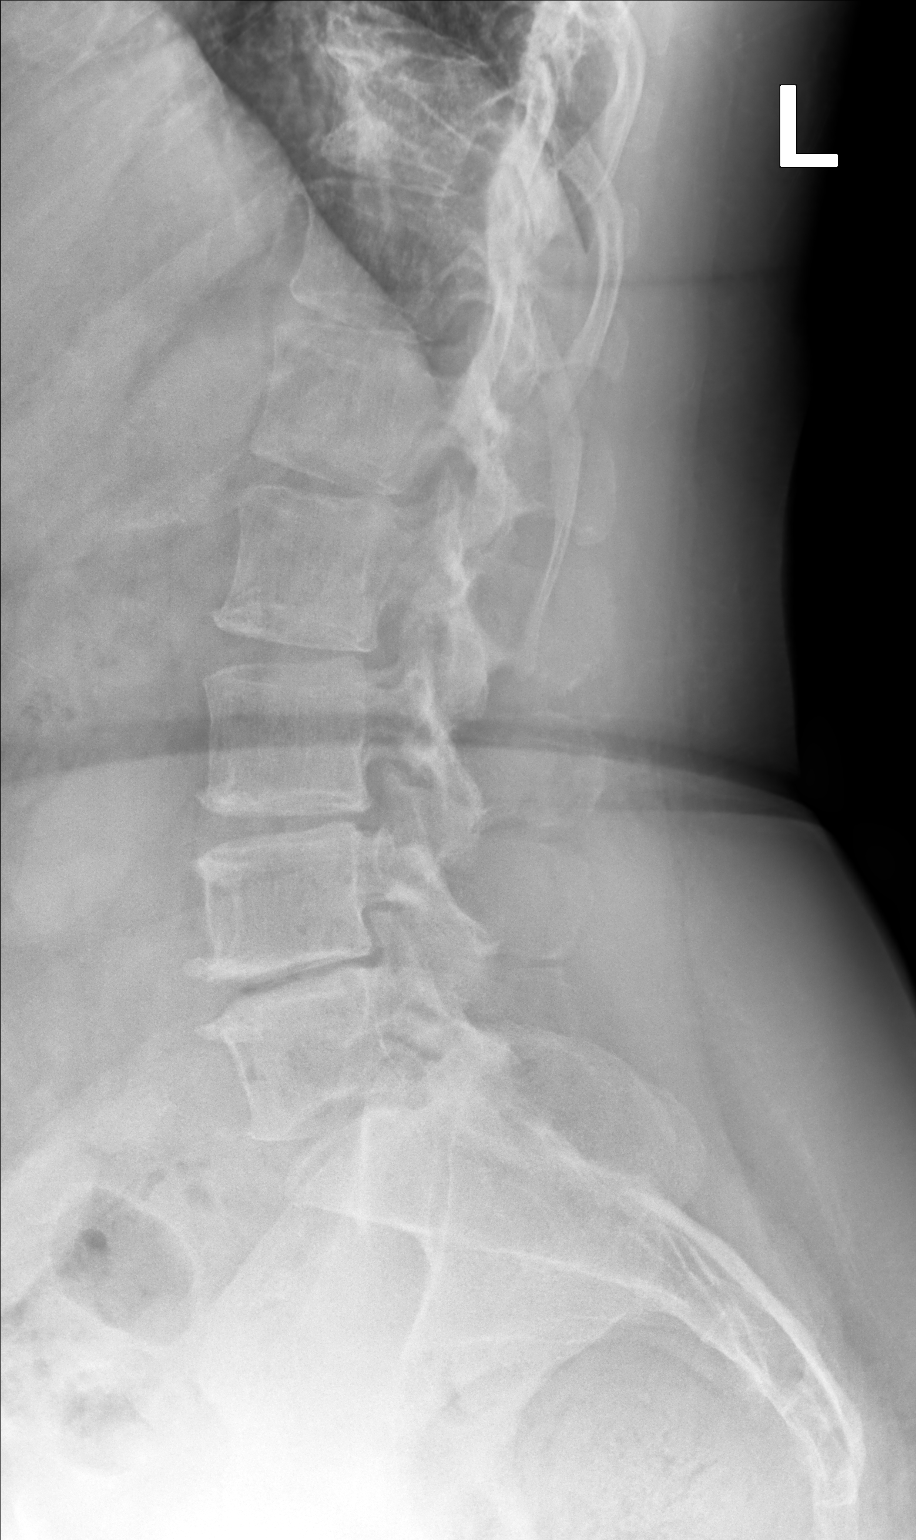

[lumbar spine lat (2 of 2)]
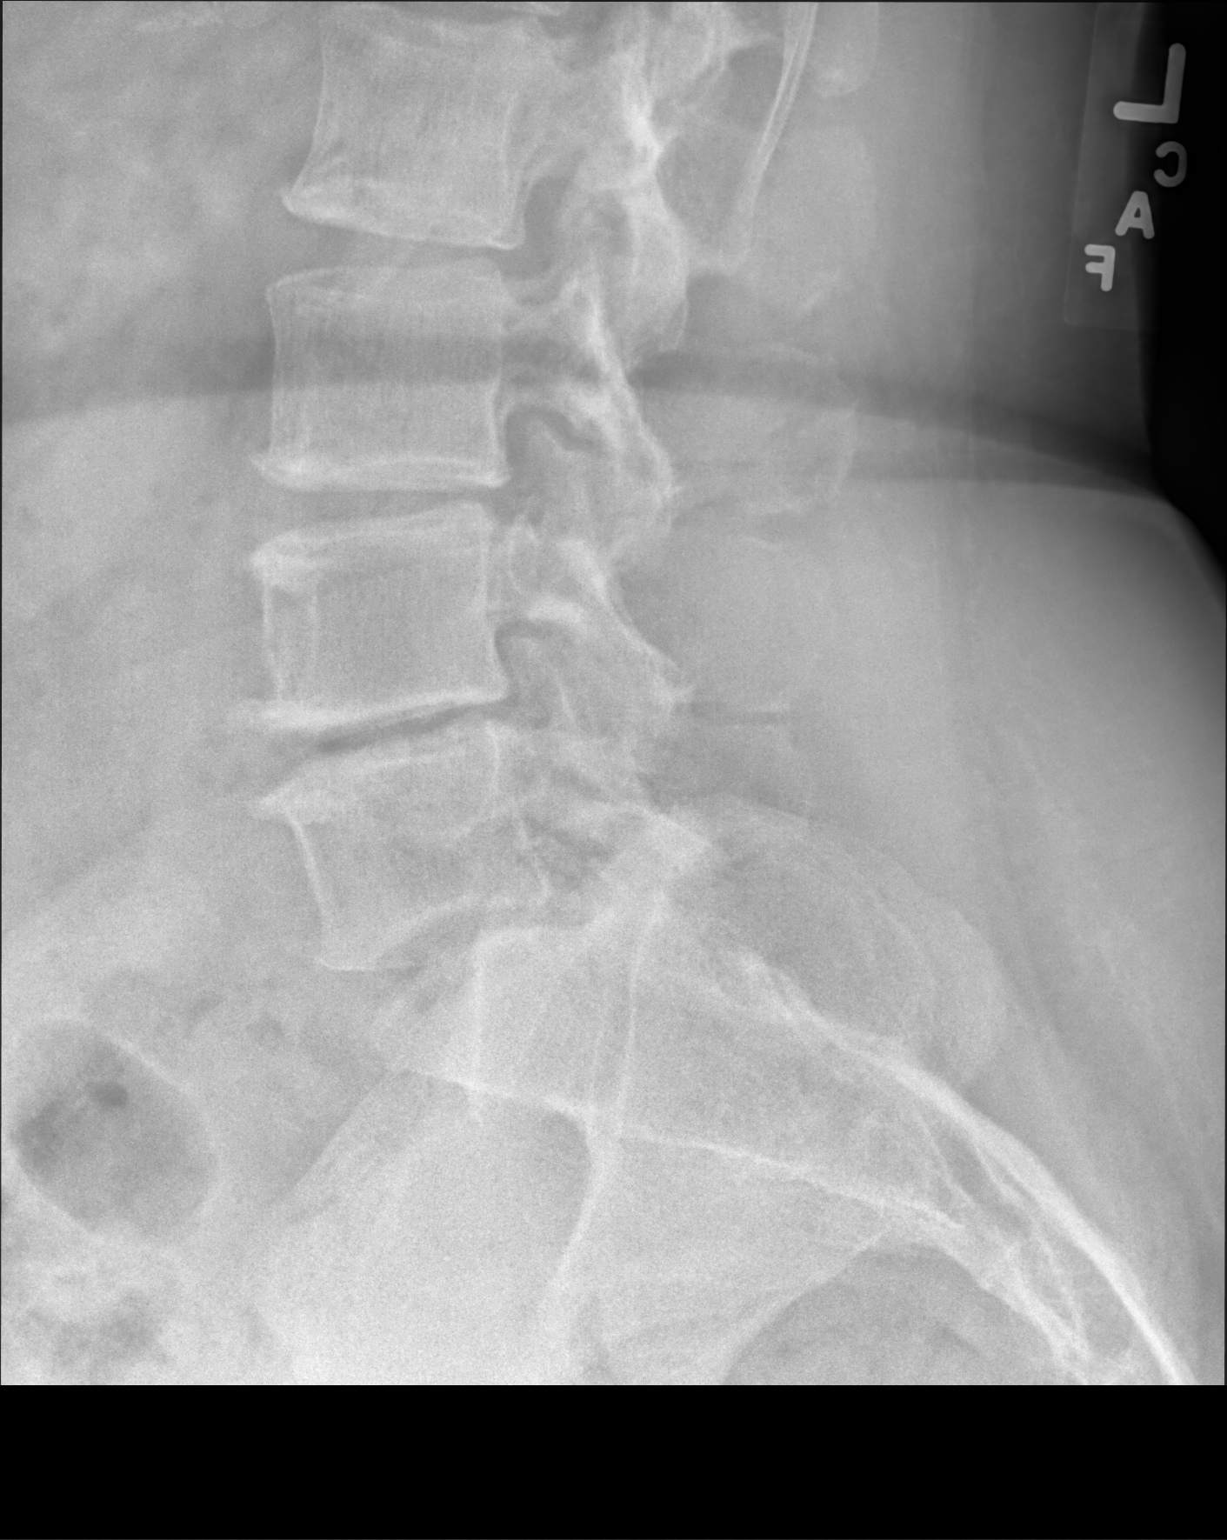

[3 of 3 positions shown; findings below may reference images not displayed]

FINDINGS: There is mild convex right scoliosis with the apex at L2-3. Loss of
disc space height and endplate spurring are seen from L3-L5, worst
at L4-5. Lower lumbar facet arthropathy is noted. Paraspinous
structures are unremarkable.
IMPRESSION: No acute abnormality.

Lumbar spondylosis appearing worst at L4-5.

Mild convex right scoliosis.

## 2019-06-12 IMAGING — DX DG HIP (WITH OR WITHOUT PELVIS) 2-3V*L*
2 series · 2 of 2 positions shown · non-contrast
Comparison: None.

CLINICAL DATA: Chronic low back and left hip pain. No known injury.

EXAM:
DG HIP (WITH OR WITHOUT PELVIS) 2-3V LEFT

[pelvis ap]
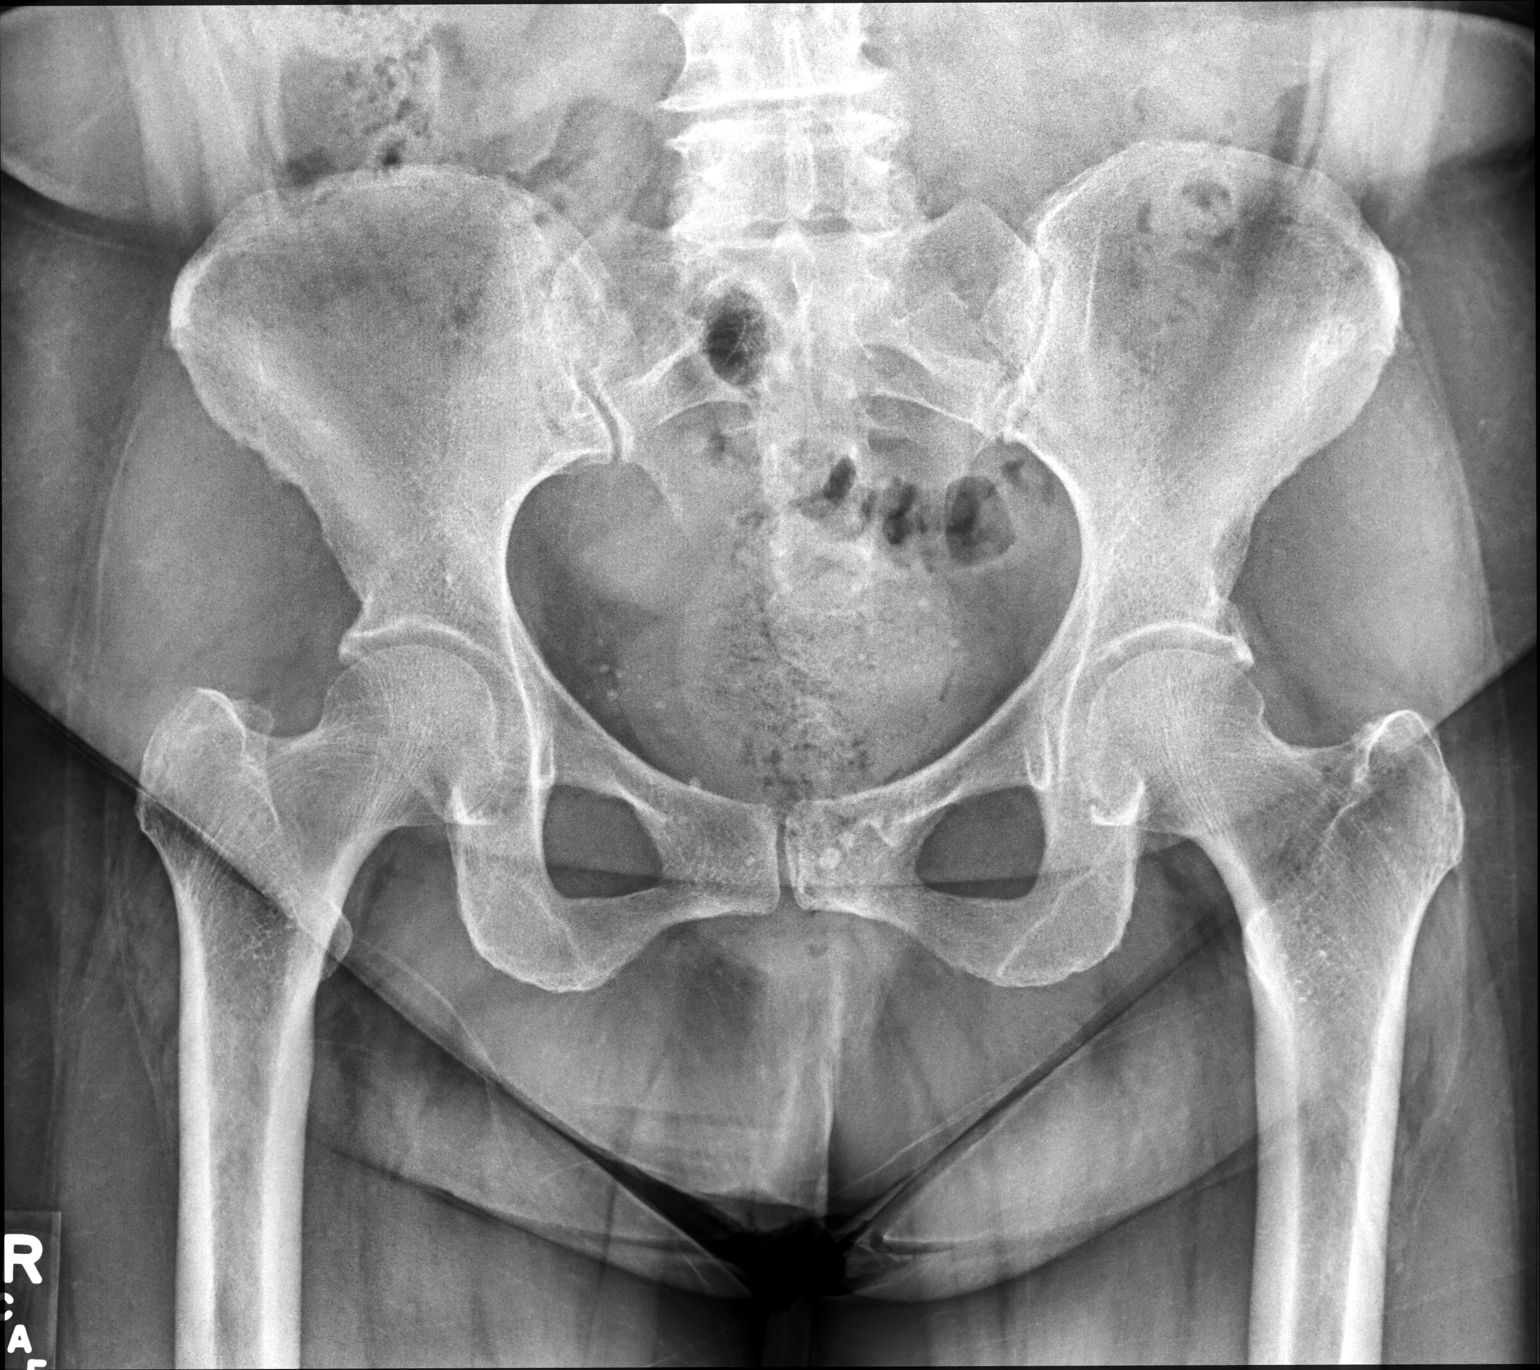

[hip joint (frog view)]
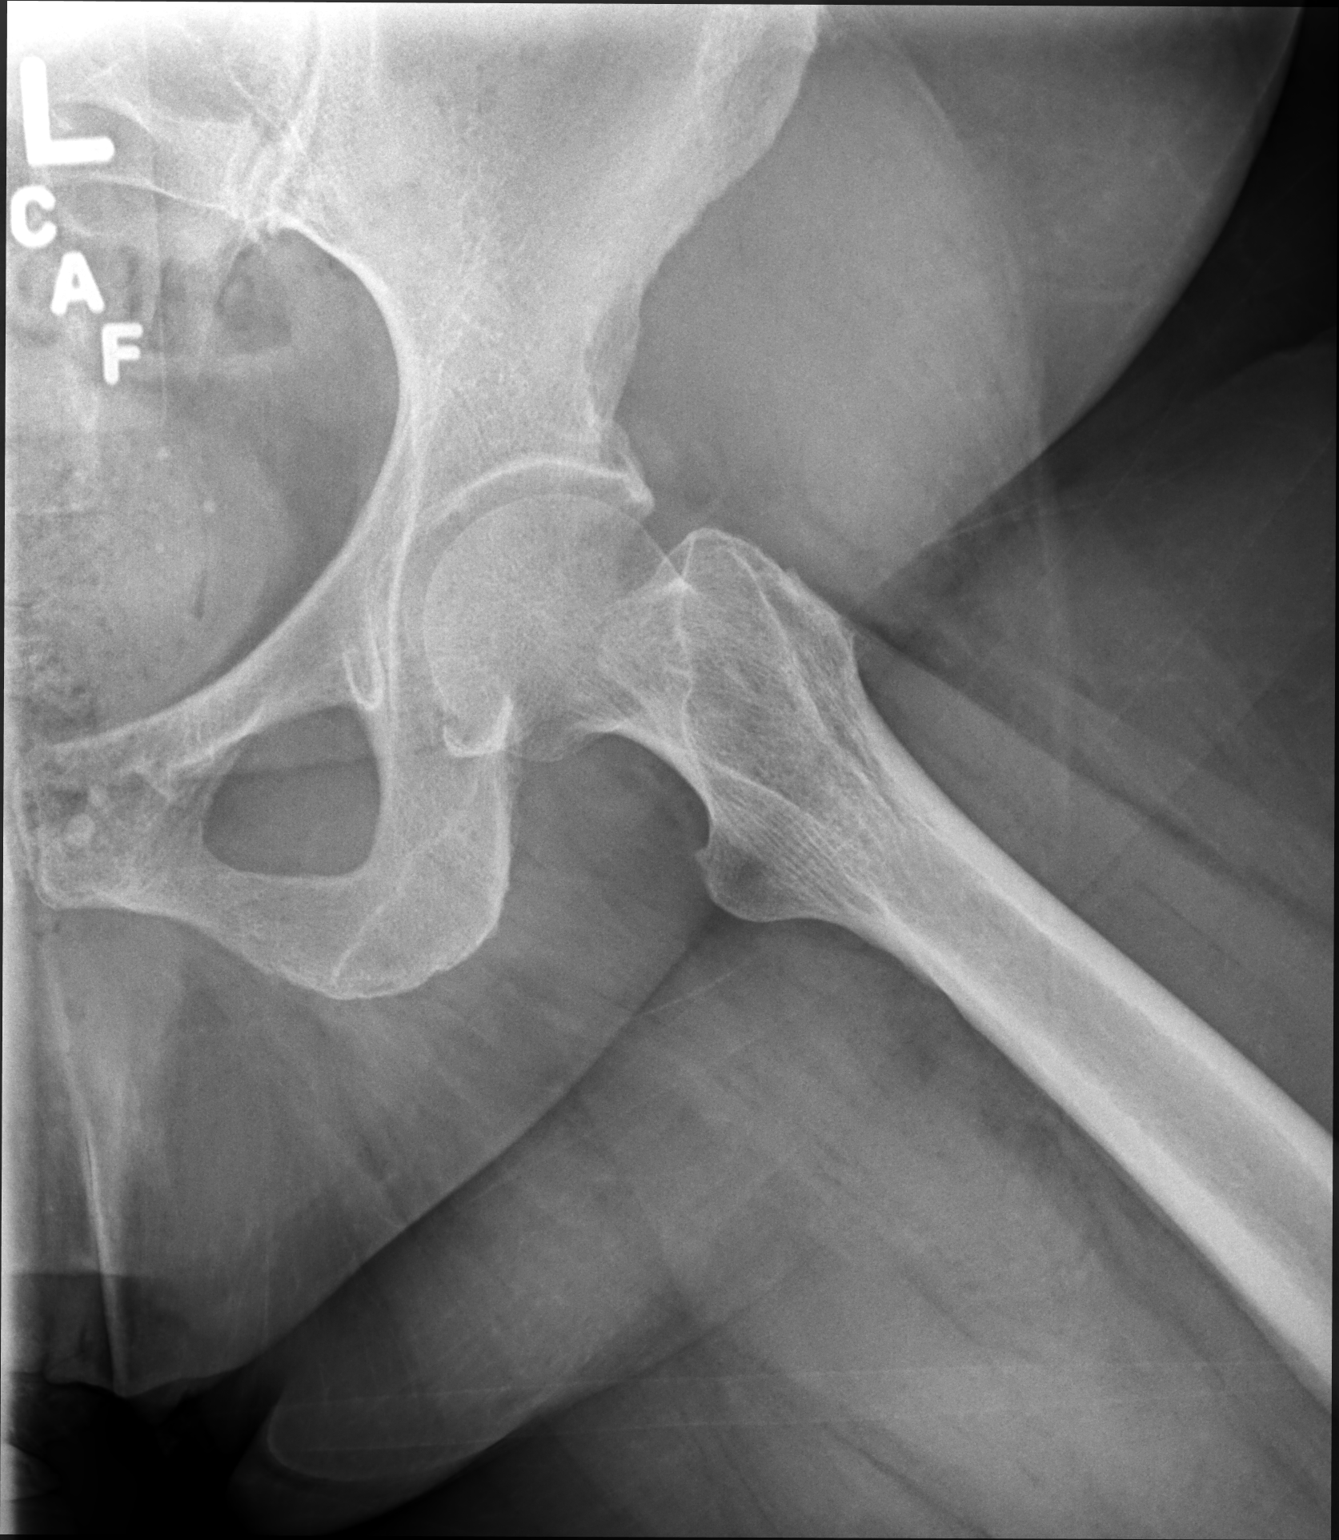

[2 of 2 positions shown; findings below may reference images not displayed]

FINDINGS: There is no evidence of hip fracture or dislocation. There is no
evidence of arthropathy or other focal bone abnormality.
IMPRESSION: Negative exam.

## 2019-06-17 ENCOUNTER — Telehealth (HOSPITAL_COMMUNITY): Payer: Self-pay

## 2019-06-17 NOTE — Telephone Encounter (Signed)
Encounter complete. 

## 2019-06-19 ENCOUNTER — Encounter (INDEPENDENT_AMBULATORY_CARE_PROVIDER_SITE_OTHER): Payer: Self-pay | Admitting: Family Medicine

## 2019-06-21 NOTE — Telephone Encounter (Signed)
Please review

## 2019-06-22 ENCOUNTER — Ambulatory Visit (HOSPITAL_COMMUNITY)
Admission: RE | Admit: 2019-06-22 | Discharge: 2019-06-22 | Disposition: A | Discharge status: Home / Self Care | Payer: BC Managed Care – PPO | Source: Ambulatory Visit | Attending: Cardiology | Admitting: Cardiology

## 2019-06-22 ENCOUNTER — Other Ambulatory Visit: Payer: Self-pay

## 2019-06-22 ENCOUNTER — Encounter: Payer: Self-pay | Admitting: Family Medicine

## 2019-06-22 DIAGNOSIS — Z01818 Encounter for other preprocedural examination: Secondary | ICD-10-CM | POA: Diagnosis not present

## 2019-06-22 MED ORDER — RYBELSUS 7 MG PO TABS: 7.0000 mg | ORAL_TABLET | Freq: Every day | ORAL | 0 refills | Status: DC

## 2019-06-22 MED ORDER — TECHNETIUM TC 99M TETROFOSMIN IV KIT
30.6000 | PACK | Freq: Once | INTRAVENOUS | Status: DC | PRN
  Administered 2019-06-22: 30.6 via INTRAVENOUS
  Filled 2019-06-22: qty 31

## 2019-06-22 MED ORDER — REGADENOSON 0.4 MG/5ML IV SOLN
0.4000 mg | Freq: Once | INTRAVENOUS | Status: DC
  Administered 2019-06-22: 0.4 mg via INTRAVENOUS

## 2019-06-23 ENCOUNTER — Ambulatory Visit (HOSPITAL_COMMUNITY)
Admission: RE | Admit: 2019-06-23 | Discharge: 2019-06-23 | Disposition: A | Discharge status: Home / Self Care | Payer: BC Managed Care – PPO | Source: Ambulatory Visit | Attending: Cardiovascular Disease | Admitting: Cardiovascular Disease

## 2019-06-23 LAB — MYOCARDIAL PERFUSION IMAGING
LV dias vol: 104 mL (ref 46–106)
LV sys vol: 36 mL
Peak HR: 74 {beats}/min
Rest HR: 57 {beats}/min
SDS: 2
SRS: 2
SSS: 4
TID: 0.97

## 2019-06-23 MED ORDER — TECHNETIUM TC 99M TETROFOSMIN IV KIT
30.4000 | PACK | Freq: Once | INTRAVENOUS | Status: AC | PRN
  Administered 2019-06-23: 30.4 via INTRAVENOUS

## 2019-06-24 ENCOUNTER — Telehealth: Payer: Self-pay

## 2019-06-24 NOTE — Telephone Encounter (Signed)
-----   Message from Jenean Lindau, MD sent at 06/23/2019  4:41 PM EST ----- The results of the study is unremarkable. Please inform patient. I will discuss in detail at next appointment. Cc  primary care/referring physician Jenean Lindau, MD 06/23/2019 4:41 PM

## 2019-06-24 NOTE — Telephone Encounter (Signed)
Results relayed, copy sent to [name]

## 2019-07-06 ENCOUNTER — Ambulatory Visit (INDEPENDENT_AMBULATORY_CARE_PROVIDER_SITE_OTHER): Payer: BC Managed Care – PPO | Admitting: Family Medicine

## 2019-07-06 ENCOUNTER — Encounter: Payer: Self-pay | Admitting: Family Medicine

## 2019-07-06 DIAGNOSIS — E785 Hyperlipidemia, unspecified: Secondary | ICD-10-CM | POA: Diagnosis not present

## 2019-07-06 DIAGNOSIS — F339 Major depressive disorder, recurrent, unspecified: Secondary | ICD-10-CM | POA: Diagnosis not present

## 2019-07-06 DIAGNOSIS — M1990 Unspecified osteoarthritis, unspecified site: Secondary | ICD-10-CM

## 2019-07-06 DIAGNOSIS — G4733 Obstructive sleep apnea (adult) (pediatric): Secondary | ICD-10-CM

## 2019-07-06 DIAGNOSIS — Z9989 Dependence on other enabling machines and devices: Secondary | ICD-10-CM

## 2019-07-06 DIAGNOSIS — E559 Vitamin D deficiency, unspecified: Secondary | ICD-10-CM | POA: Diagnosis not present

## 2019-07-06 DIAGNOSIS — R739 Hyperglycemia, unspecified: Secondary | ICD-10-CM | POA: Diagnosis not present

## 2019-07-06 MED ORDER — VENLAFAXINE HCL ER 75 MG PO CP24: 75.0000 mg | ORAL_CAPSULE | Freq: Every day | ORAL | 3 refills | Status: DC

## 2019-07-06 MED ORDER — RYBELSUS 7 MG PO TABS: 7.0000 mg | ORAL_TABLET | Freq: Every day | ORAL | 3 refills | Status: DC

## 2019-07-06 NOTE — Assessment & Plan Note (Signed)
Continue management per orthopedics. 

## 2019-07-06 NOTE — Progress Notes (Signed)
   Chief Complaint:  Brooke Clarke is a 58 y.o. female who presents today for a virtual office visit with a chief complaint of depression.   Assessment/Plan:  Depression, recurrent (HCC) Stable.  Continue Effexor 75 mg daily.  Vitamin D deficiency Stable.  Continue vitamin D 50,000 international units weekly.  Recheck in 3 to 6 months.  Hyperglycemia Continue Rybelsus 7 mg daily.  Check A1c with next blood draw.  Dyslipidemia Continue lifestyle modifications.  Check lipids with next blood draw.  Morbid obesity ([place]) Continue Rybelsus.  Osteoarthritis Continue management per orthopedics.    Subjective:  HPI:  Her stable, chronic medical conditions are outlined below:   # Depression - On Effexor 75 mg daily.  Tolerating well - ROS: No reported SI or HI  # Prediabetes - On Rybelsus 7mg  daily and tolerating well  # Vitamin D Deficiency - On 50000IU weekly and tolerating well  # Dyslipidemia - No currently on any medications  # Osteoarthritis - Follows with orthopedics   % OSA on CPAP  ROS: Per HPI  PMH: She reports that she has never smoked. She has never used smokeless tobacco. She reports previous alcohol use. She reports that she does not use drugs.      Objective/Observations  Physical Exam: Gen: NAD, resting comfortably Pulm: Normal work of breathing Neuro: Grossly normal, moves all extremities Psych: Normal affect and thought content  Virtual Visit via Video   I connected with [place] on 07/06/19 at 11:00 AM EST by a video enabled telemedicine application and verified that I am speaking with the correct person using two identifiers. I discussed the limitations of evaluation and management by telemedicine and the availability of in person appointments. The patient expressed understanding and agreed to proceed.   Patient location: Home Provider location: [place] participating in the virtual visit: Myself and Patient      Algis Greenhouse. Jerline Pain, MD 07/06/2019 12:13 PM

## 2019-07-06 NOTE — Assessment & Plan Note (Signed)
Stable.  Continue Effexor 75 mg daily. 

## 2019-07-06 NOTE — Assessment & Plan Note (Signed)
Stable.  Continue vitamin D 50,000 international units weekly.  Recheck in 3 to 6 months.

## 2019-07-06 NOTE — Assessment & Plan Note (Signed)
Continue Rybelsus 7 mg daily.  Check A1c with next blood draw.

## 2019-07-06 NOTE — Assessment & Plan Note (Signed)
Continue lifestyle modifications.  Check lipids with next blood draw.

## 2019-07-06 NOTE — Assessment & Plan Note (Signed)
Continue Rybelsus.

## 2019-07-21 DIAGNOSIS — R928 Other abnormal and inconclusive findings on diagnostic imaging of breast: Secondary | ICD-10-CM | POA: Diagnosis not present

## 2019-07-21 LAB — HM MAMMOGRAPHY

## 2019-07-22 ENCOUNTER — Encounter: Payer: Self-pay | Admitting: Family Medicine

## 2019-07-22 DIAGNOSIS — Z01818 Encounter for other preprocedural examination: Secondary | ICD-10-CM | POA: Diagnosis not present

## 2019-07-23 NOTE — Telephone Encounter (Signed)
Patient requesting call back regarding surgical clearance paperwork.

## 2019-07-23 NOTE — Telephone Encounter (Signed)
See note  Copied from [place] [id number]. Topic: General - Other >> Jul 22, 2019  4:38 PM Rainey Pines A wrote: Patient stated that the paperwork for her surgical clearance for her knee replacement surgery on 12/15 has still not been received by her surgeon Dr. Wynelle Link at [facility]. Fax number [phone] and phone number [phone]. Patient would like a callback at [phone] once surgical clearance paperwork has been sent. Please advise

## 2019-07-26 NOTE — Telephone Encounter (Signed)
See note, needs resolved today due to appointment tomorrow.  Copied from [place] [id number]. Topic: General - Other >> Jul 26, 2019 11:04 AM Lennox Solders wrote: Reason for CRM:pt has sent my chart message. Pt is schedule for r knee surgery tomorrow  07-27-2019 and dr Maureen Ralphs needs surgical clearance form to be complete. Please call dr Maureen Ralphs to get another form if need. Please call pt once form has been completed

## 2019-07-26 NOTE — Telephone Encounter (Signed)
Noted  

## 2019-07-26 NOTE — Telephone Encounter (Signed)
Patient came into the office upset that she has not heard anything and the mychart message was sent on 12/10. I followed up with Dr. Jerline Pain and his team and they showed me that the paperwork came in today 07/26/2019. I faxed the form to [facility] to [phone] and to [facility] to [facility] as requested from the patient to [phone]. I apologized she never received a call to update on 12/10 that the forms were not received. Both forms faxed successfully. No further action required.

## 2019-07-27 DIAGNOSIS — M1712 Unilateral primary osteoarthritis, left knee: Secondary | ICD-10-CM | POA: Diagnosis not present

## 2019-08-04 DIAGNOSIS — M25562 Pain in left knee: Secondary | ICD-10-CM | POA: Diagnosis not present

## 2019-08-09 DIAGNOSIS — M25562 Pain in left knee: Secondary | ICD-10-CM | POA: Diagnosis not present

## 2019-08-11 DIAGNOSIS — M25562 Pain in left knee: Secondary | ICD-10-CM | POA: Diagnosis not present

## 2019-08-16 ENCOUNTER — Encounter: Payer: Self-pay | Admitting: Family Medicine

## 2019-08-16 DIAGNOSIS — F339 Major depressive disorder, recurrent, unspecified: Secondary | ICD-10-CM

## 2019-08-16 DIAGNOSIS — M25562 Pain in left knee: Secondary | ICD-10-CM | POA: Diagnosis not present

## 2019-08-16 MED ORDER — VENLAFAXINE HCL ER 75 MG PO CP24: 75.0000 mg | ORAL_CAPSULE | Freq: Every day | ORAL | 1 refills | Status: DC

## 2019-08-19 DIAGNOSIS — M25562 Pain in left knee: Secondary | ICD-10-CM | POA: Diagnosis not present

## 2019-08-26 DIAGNOSIS — M25562 Pain in left knee: Secondary | ICD-10-CM | POA: Diagnosis not present

## 2019-08-31 DIAGNOSIS — Z96652 Presence of left artificial knee joint: Secondary | ICD-10-CM | POA: Diagnosis not present

## 2019-08-31 DIAGNOSIS — Z471 Aftercare following joint replacement surgery: Secondary | ICD-10-CM | POA: Diagnosis not present

## 2019-09-04 ENCOUNTER — Encounter: Payer: Self-pay | Admitting: Family Medicine

## 2019-09-09 DIAGNOSIS — M25562 Pain in left knee: Secondary | ICD-10-CM | POA: Diagnosis not present

## 2019-09-20 ENCOUNTER — Encounter: Payer: Self-pay | Admitting: Family Medicine

## 2019-09-21 ENCOUNTER — Other Ambulatory Visit: Payer: Self-pay

## 2019-09-21 NOTE — Telephone Encounter (Signed)
Please Advise

## 2019-09-28 ENCOUNTER — Other Ambulatory Visit: Payer: Self-pay

## 2019-09-28 MED ORDER — SEMAGLUTIDE 14 MG PO TABS: 14.0000 mg | ORAL_TABLET | Freq: Every day | ORAL | 3 refills | Status: DC

## 2019-09-28 NOTE — Progress Notes (Signed)
Sent to pharmacy 

## 2019-10-07 DIAGNOSIS — Z7689 Persons encountering health services in other specified circumstances: Secondary | ICD-10-CM | POA: Diagnosis not present

## 2019-10-07 DIAGNOSIS — M1711 Unilateral primary osteoarthritis, right knee: Secondary | ICD-10-CM | POA: Diagnosis not present

## 2019-10-13 ENCOUNTER — Encounter: Payer: Self-pay | Admitting: Family Medicine

## 2019-10-17 ENCOUNTER — Encounter: Payer: Self-pay | Admitting: Family Medicine

## 2019-10-18 ENCOUNTER — Encounter: Payer: Self-pay | Admitting: Family Medicine

## 2019-10-18 ENCOUNTER — Ambulatory Visit (INDEPENDENT_AMBULATORY_CARE_PROVIDER_SITE_OTHER): Payer: BC Managed Care – PPO | Admitting: Family Medicine

## 2019-10-18 VITALS — BP 128/72 | HR 76 | Temp 97.0°F | Ht 63.0 in | Wt 224.2 lb

## 2019-10-18 DIAGNOSIS — L989 Disorder of the skin and subcutaneous tissue, unspecified: Secondary | ICD-10-CM | POA: Diagnosis not present

## 2019-10-18 DIAGNOSIS — Z124 Encounter for screening for malignant neoplasm of cervix: Secondary | ICD-10-CM

## 2019-10-18 DIAGNOSIS — R350 Frequency of micturition: Secondary | ICD-10-CM | POA: Diagnosis not present

## 2019-10-18 DIAGNOSIS — F339 Major depressive disorder, recurrent, unspecified: Secondary | ICD-10-CM | POA: Diagnosis not present

## 2019-10-18 LAB — POC URINALSYSI DIPSTICK (AUTOMATED)
Bilirubin, UA: NEGATIVE
Blood, UA: POSITIVE
Glucose, UA: NEGATIVE
Ketones, UA: NEGATIVE
Nitrite, UA: POSITIVE
Protein, UA: POSITIVE — AB
Spec Grav, UA: 1.015 (ref 1.010–1.025)
Urobilinogen, UA: 0.2 E.U./dL
pH, UA: 5 (ref 5.0–8.0)

## 2019-10-18 MED ORDER — CIPROFLOXACIN HCL 500 MG PO TABS: 500.0000 mg | ORAL_TABLET | Freq: Two times a day (BID) | ORAL | 0 refills | Status: AC

## 2019-10-18 MED ORDER — VENLAFAXINE HCL ER 37.5 MG PO CP24: 37.5000 mg | ORAL_CAPSULE | Freq: Every day | ORAL | 0 refills | Status: DC

## 2019-10-18 NOTE — Progress Notes (Signed)
   Brooke Clarke is a 59 y.o. female who presents today for an office visit.  Assessment/Plan:  New/Acute Problems: UTI Concern for early signs of pyelonephritis though afebrile today.   Will start ciprofloxacin.  Encourage good oral hydration.  Check urine culture.  Discussed reasons return to care.  Skin Lesions Will place referral to dermatology per patient request.  Chronic Problems Addressed Today: Depression, recurrent ([place]) She would like to wean off Effexor due to concern for side effects.  She will be looking into starting [place].  We will start Effexor 37.5 mg daily for the next 1 to 2 weeks, then 37.5 mg every other day for the next 1 to 2 weeks, then off.  She will check in with me in a few weeks to let me know how she is doing as we wean down.    Subjective:  HPI:  Patient is here today with concern for UTI.  Symptoms started about a week ago.  Is having burning with urination.  She has tried taking Azo with no significant improvement.  No fevers though did start having some back pain and chills yesterday.  No nausea or vomiting.  Feels similar to prior UTIs.  She is also concerned about possible side effects with her Effexor.  She thinks she is having some memory issues.  Feels like it is working okay to help with her mood.        Objective:  Physical Exam: BP 128/72   Pulse 76   Temp (!) 97 F (36.1 C)   Ht 5\' 3"  (1.6 m)   Wt 224 lb 3.2 oz (101.7 kg)   SpO2 95%   BMI 39.72 kg/m   Wt Readings from Last 3 Encounters:  10/18/19 224 lb 3.2 oz (101.7 kg)  06/22/19 238 lb (108 kg)  06/10/19 238 lb (108 kg)    Gen: No acute distress, resting comfortably CV: Regular rate and rhythm with no murmurs appreciated Pulm: Normal work of breathing, clear to auscultation bilaterally with no crackles, wheezes, or rhonchi Neuro: Grossly normal, moves all extremities MSK: Mild left CVA tenderness.  Psych: Normal affect and thought content      Sim Choquette M. Jerline Pain, MD 10/18/2019  9:31 AM

## 2019-10-18 NOTE — Assessment & Plan Note (Addendum)
She would like to wean off Effexor due to concern for side effects.  She will be looking into starting [place].  We will start Effexor 37.5 mg daily for the next 1 to 2 weeks, then 37.5 mg every other day for the next 1 to 2 weeks, then off.  She will check in with me in a few weeks to let me know how she is doing as we wean down.

## 2019-10-18 NOTE — Patient Instructions (Signed)
It was nice to see you!  You have a urinary tract infection. Please start the antibiotic.  We will check a urine culture to make sure you do not have a resistant bacteria. We will call you if we need to change your medications.   Please make sure you are drinking plenty of fluids over the next few days.  If your symptoms do not improve over the next 5-7 days, or if they worsen, please let us know. Please also let us know if you have worsening back pain, fevers, chills, or body aches.   I will place a referral for you to see the gynecologist and the dermatologist.  We will wean down your Effexor.  Please do 37.5 mg every day for the next 1 to 2 weeks.  Then take 37.5 mg every other day for another 1 to 2 weeks, then stop completely.  Please check in with me in a few weeks to let me know how things are going as we wean down on your Effexor.  Take care, Dr Jerline Pain

## 2019-10-20 ENCOUNTER — Encounter: Payer: Self-pay | Admitting: Family Medicine

## 2019-10-20 LAB — URINE CULTURE
MICRO NUMBER:: 10226048
SPECIMEN QUALITY:: ADEQUATE

## 2019-10-21 ENCOUNTER — Encounter: Payer: Self-pay | Admitting: Family Medicine

## 2019-10-25 ENCOUNTER — Telehealth: Payer: Self-pay | Admitting: Family Medicine

## 2019-10-25 NOTE — Progress Notes (Signed)
Please inform patient of the following:  Urine culture confirmed UTI. The antibiotic she was prescribed should treat this. Would like for her to let us know if symptoms have not improved.  Algis Greenhouse. Jerline Pain, MD 10/25/2019 9:31 AM

## 2019-10-25 NOTE — Telephone Encounter (Signed)
Patient has called back.  I have given her Dr. Ellwood Handler response to her UA.  Patient understood.

## 2019-10-27 ENCOUNTER — Ambulatory Visit: Payer: BC Managed Care – PPO | Admitting: Obstetrics and Gynecology

## 2019-11-02 DIAGNOSIS — M1711 Unilateral primary osteoarthritis, right knee: Secondary | ICD-10-CM | POA: Diagnosis not present

## 2019-11-03 DIAGNOSIS — G4733 Obstructive sleep apnea (adult) (pediatric): Secondary | ICD-10-CM | POA: Diagnosis not present

## 2019-12-06 ENCOUNTER — Encounter: Payer: Self-pay | Admitting: Family Medicine

## 2019-12-07 DIAGNOSIS — Z96651 Presence of right artificial knee joint: Secondary | ICD-10-CM | POA: Diagnosis not present

## 2019-12-07 DIAGNOSIS — Z471 Aftercare following joint replacement surgery: Secondary | ICD-10-CM | POA: Diagnosis not present

## 2019-12-08 ENCOUNTER — Other Ambulatory Visit: Payer: Self-pay | Admitting: *Deleted

## 2019-12-08 MED ORDER — VENLAFAXINE HCL ER 75 MG PO CP24: 75.0000 mg | ORAL_CAPSULE | Freq: Every day | ORAL | 1 refills | Status: DC

## 2019-12-08 NOTE — Telephone Encounter (Signed)
Rx send to pharmacy Advised to call surgeon for changes on Methocarbamol and Hydromorphone

## 2019-12-08 NOTE — Telephone Encounter (Signed)
Spoke with patient.Stated want to be wean down from Effexor slowly. When cold [place] on Rx Methocarbamol and Rx hydromorphone.  Please advise

## 2019-12-09 ENCOUNTER — Ambulatory Visit (INDEPENDENT_AMBULATORY_CARE_PROVIDER_SITE_OTHER): Payer: BC Managed Care – PPO | Admitting: Physician Assistant

## 2019-12-09 ENCOUNTER — Encounter: Payer: Self-pay | Admitting: Physician Assistant

## 2019-12-09 ENCOUNTER — Other Ambulatory Visit: Payer: Self-pay

## 2019-12-09 DIAGNOSIS — Z808 Family history of malignant neoplasm of other organs or systems: Secondary | ICD-10-CM | POA: Diagnosis not present

## 2019-12-09 DIAGNOSIS — L720 Epidermal cyst: Secondary | ICD-10-CM

## 2019-12-09 DIAGNOSIS — D485 Neoplasm of uncertain behavior of skin: Secondary | ICD-10-CM | POA: Diagnosis not present

## 2019-12-09 DIAGNOSIS — Z1283 Encounter for screening for malignant neoplasm of skin: Secondary | ICD-10-CM | POA: Diagnosis not present

## 2019-12-09 DIAGNOSIS — D225 Melanocytic nevi of trunk: Secondary | ICD-10-CM | POA: Diagnosis not present

## 2019-12-09 DIAGNOSIS — Z471 Aftercare following joint replacement surgery: Secondary | ICD-10-CM | POA: Diagnosis not present

## 2019-12-09 DIAGNOSIS — M25561 Pain in right knee: Secondary | ICD-10-CM | POA: Diagnosis not present

## 2019-12-09 MED ORDER — TRETINOIN 0.025 % EX CREA: TOPICAL_CREAM | Freq: Every evening | CUTANEOUS | 1 refills | Status: DC

## 2019-12-09 NOTE — Progress Notes (Signed)
   New Patient Visit  Subjective  Brooke Clarke is a 59 y.o. female who presents for the following: New Patient (Initial Visit) (Here today for a mole check. Her mother had a history of melanoma. Also, has noticed an increase in milia across her face. Does have one that is on her right cheek that she has had for awhile.). She has been getting more milia. Aesthetician noted them on her last facial visit. She also has a bump on her left chin. She has used Differin in the past.  Objective  Well appearing patient in no apparent distress; mood and affect are within normal limits.  A full examination was performed including head, eyes, ears, nose, lips, neck, chest, axillae, abdomen, back, buttocks, bilateral upper extremities, bilateral lower extremities, hands, feet, fingers, toes, fingernails, and toenails. All findings within normal limits unless otherwise noted below.  Objective  Head - Anterior (Face): White papules.  Objective  Right Lower Back: 18 cm to angioma in middle back Brown macule with irregular shape and color.        Assessment & Plan   Skin Cancer Screening performed to today.   Milia Head - Anterior (Face)  tretinoin (RETIN-A) 0.025 % cream - Head - Anterior (Face)  Neoplasm of uncertain behavior of skin Right Lower Back  Skin / nail biopsy Type of biopsy: tangential   Timeout: patient name, date of birth, surgical site, and procedure verified   Instrument used: flexible razor blade   Hemostasis achieved with: ferric subsulfate   Outcome: patient tolerated procedure well   Post-procedure details: wound care instructions given    Specimen 1 - Surgical pathology Differential Diagnosis: atypia Check Margins: No  I have reviewed the above documentation for accuracy and completeness and I agree with the above.  Remedios Mckone Clark-Bruning PA-C

## 2019-12-09 NOTE — Patient Instructions (Signed)

## 2019-12-15 ENCOUNTER — Telehealth: Payer: Self-pay | Admitting: *Deleted

## 2019-12-15 DIAGNOSIS — H43391 Other vitreous opacities, right eye: Secondary | ICD-10-CM | POA: Diagnosis not present

## 2019-12-15 DIAGNOSIS — H2513 Age-related nuclear cataract, bilateral: Secondary | ICD-10-CM | POA: Diagnosis not present

## 2019-12-15 NOTE — Telephone Encounter (Signed)
-----   Message from Arlyss Gandy, [place] sent at 12/15/2019 10:06 AM EDT ----- Mild. Recheck 6 months.

## 2019-12-15 NOTE — Telephone Encounter (Signed)
Path to patient. Anderson [place] Clerk-Bruning said to follow up in 6 months. Patient will call back to schedule follow up.

## 2020-01-12 ENCOUNTER — Other Ambulatory Visit: Payer: Self-pay

## 2020-01-12 ENCOUNTER — Encounter: Payer: Self-pay | Admitting: Family Medicine

## 2020-01-12 MED ORDER — VENLAFAXINE HCL ER 75 MG PO CP24: 75.0000 mg | ORAL_CAPSULE | Freq: Every day | ORAL | 1 refills | Status: DC

## 2020-01-25 ENCOUNTER — Other Ambulatory Visit: Payer: Self-pay

## 2020-01-25 ENCOUNTER — Other Ambulatory Visit: Payer: Self-pay | Admitting: Physician Assistant

## 2020-01-25 MED ORDER — TRETINOIN 0.1 % EX CREA: TOPICAL_CREAM | Freq: Every evening | CUTANEOUS | 2 refills | Status: AC

## 2020-01-25 MED ORDER — VENLAFAXINE HCL 37.5 MG PO TABS: 37.5000 mg | ORAL_TABLET | Freq: Every day | ORAL | 0 refills | Status: DC

## 2020-01-25 NOTE — Telephone Encounter (Signed)
Left message with patient informing her that I sent the stronger dose of tretinoin cream to [facility] per [facility] [place].  Told patient to call us if needed.

## 2020-01-25 NOTE — Telephone Encounter (Signed)
Patient was told to call if she wanted strength of Retin-A increased. She would like for [facility] to send new RX for stronger medication to [place].

## 2020-01-26 DIAGNOSIS — H43392 Other vitreous opacities, left eye: Secondary | ICD-10-CM | POA: Diagnosis not present

## 2020-02-07 ENCOUNTER — Encounter: Payer: Self-pay | Admitting: Family Medicine

## 2020-02-08 DIAGNOSIS — F332 Major depressive disorder, recurrent severe without psychotic features: Secondary | ICD-10-CM | POA: Diagnosis not present

## 2020-02-11 ENCOUNTER — Telehealth: Payer: Self-pay | Admitting: Family Medicine

## 2020-02-11 NOTE — Telephone Encounter (Signed)
authorization to disclose protected health information paperwork fax completed on 02/11/20

## 2020-02-22 DIAGNOSIS — M791 Myalgia, unspecified site: Secondary | ICD-10-CM | POA: Diagnosis not present

## 2020-02-22 DIAGNOSIS — M9901 Segmental and somatic dysfunction of cervical region: Secondary | ICD-10-CM | POA: Diagnosis not present

## 2020-02-22 DIAGNOSIS — M9902 Segmental and somatic dysfunction of thoracic region: Secondary | ICD-10-CM | POA: Diagnosis not present

## 2020-02-22 DIAGNOSIS — M9903 Segmental and somatic dysfunction of lumbar region: Secondary | ICD-10-CM | POA: Diagnosis not present

## 2020-02-28 ENCOUNTER — Encounter: Payer: Self-pay | Admitting: Family Medicine

## 2020-02-28 ENCOUNTER — Ambulatory Visit (INDEPENDENT_AMBULATORY_CARE_PROVIDER_SITE_OTHER): Payer: BC Managed Care – PPO | Admitting: Family Medicine

## 2020-02-28 ENCOUNTER — Other Ambulatory Visit: Payer: Self-pay

## 2020-02-28 VITALS — BP 131/84 | HR 64 | Temp 97.0°F | Ht 63.0 in | Wt 233.0 lb

## 2020-02-28 DIAGNOSIS — R739 Hyperglycemia, unspecified: Secondary | ICD-10-CM | POA: Diagnosis not present

## 2020-02-28 DIAGNOSIS — E785 Hyperlipidemia, unspecified: Secondary | ICD-10-CM

## 2020-02-28 DIAGNOSIS — R350 Frequency of micturition: Secondary | ICD-10-CM

## 2020-02-28 DIAGNOSIS — F339 Major depressive disorder, recurrent, unspecified: Secondary | ICD-10-CM

## 2020-02-28 DIAGNOSIS — L299 Pruritus, unspecified: Secondary | ICD-10-CM

## 2020-02-28 LAB — POC URINALSYSI DIPSTICK (AUTOMATED)
Bilirubin, UA: NEGATIVE
Blood, UA: NEGATIVE
Glucose, UA: NEGATIVE
Ketones, UA: NEGATIVE
Leukocytes, UA: NEGATIVE
Nitrite, UA: NEGATIVE
Protein, UA: NEGATIVE
Spec Grav, UA: 1.02 (ref 1.010–1.025)
Urobilinogen, UA: 0.2 E.U./dL
pH, UA: 5 (ref 5.0–8.0)

## 2020-02-28 LAB — COMPREHENSIVE METABOLIC PANEL
ALT: 16 U/L (ref 0–35)
AST: 14 U/L (ref 0–37)
Albumin: 4.1 g/dL (ref 3.5–5.2)
Alkaline Phosphatase: 106 U/L (ref 39–117)
BUN: 17 mg/dL (ref 6–23)
CO2: 28 mEq/L (ref 19–32)
Calcium: 9.3 mg/dL (ref 8.4–10.5)
Chloride: 107 mEq/L (ref 96–112)
Creatinine, Ser: 0.66 mg/dL (ref 0.40–1.20)
GFR: 91.04 mL/min (ref >60.00)
Glucose, Bld: 106 mg/dL — ABNORMAL HIGH (ref 70–99)
Potassium: 4.4 mEq/L (ref 3.5–5.1)
Sodium: 141 mEq/L (ref 135–145)
Total Bilirubin: 0.3 mg/dL (ref 0.2–1.2)
Total Protein: 6.5 g/dL (ref 6.0–8.3)

## 2020-02-28 LAB — LIPID PANEL
Cholesterol: 237 mg/dL — ABNORMAL HIGH (ref 0–200)
HDL: 53.3 mg/dL (ref >39.00)
LDL Cholesterol: 153 mg/dL — ABNORMAL HIGH (ref 0–99)
NonHDL: 183.34
Total CHOL/HDL Ratio: 4
Triglycerides: 153 mg/dL — ABNORMAL HIGH (ref 0.0–149.0)
VLDL: 30.6 mg/dL (ref 0.0–40.0)

## 2020-02-28 LAB — CBC
HCT: 41.3 % (ref 36.0–46.0)
Hemoglobin: 13.8 g/dL (ref 12.0–15.0)
MCHC: 33.5 g/dL (ref 30.0–36.0)
MCV: 88.1 fl (ref 78.0–100.0)
Platelets: 195 10*3/uL (ref 150.0–400.0)
RBC: 4.69 Mil/uL (ref 3.87–5.11)
RDW: 15.1 % (ref 11.5–15.5)
WBC: 7.3 10*3/uL (ref 4.0–10.5)

## 2020-02-28 LAB — TSH: TSH: 2.48 u[IU]/mL (ref 0.35–4.50)

## 2020-02-28 LAB — HEMOGLOBIN A1C: Hgb A1c MFr Bld: 5.8 % (ref 4.6–6.5)

## 2020-02-28 MED ORDER — OZEMPIC (0.25 OR 0.5 MG/DOSE) 2 MG/1.5ML ~~LOC~~ SOPN: 0.2500 mg | PEN_INJECTOR | SUBCUTANEOUS | 3 refills | Status: DC

## 2020-02-28 MED ORDER — VENLAFAXINE HCL ER 75 MG PO CP24: 75.0000 mg | ORAL_CAPSULE | Freq: Every day | ORAL | 1 refills | Status: DC

## 2020-02-28 NOTE — Patient Instructions (Signed)
It was very nice to see you today!  Please go back to 75mg  of effexor. I will send in a new prescription today.  We will start Ozempic.  Please use 0.25 mg weekly for the first 2 weeks.  You can then increase to 0.5 mg weekly.  I will see you back in 3 months.  Please try using capsaicin gel to help with your itching.  We will check more labs today.  If you continue to have urinary frequency we may try a medication and refer you to see a urologist.  Take care, Dr Jerline Pain  Please try these tips to maintain a healthy lifestyle:   Eat at least 3 REAL meals and 1-2 snacks per day.  Aim for no more than 5 hours between eating.  If you eat breakfast, please do so within one hour of getting up.    Each meal should contain half fruits/vegetables, one quarter protein, and one quarter carbs (no bigger than a computer mouse)   Cut down on sweet beverages. This includes juice, soda, and sweet tea.     Drink at least 1 glass of water with each meal and aim for at least 8 glasses per day   Exercise at least 150 minutes every week.

## 2020-02-28 NOTE — Assessment & Plan Note (Signed)
Check lipid panel  

## 2020-02-28 NOTE — Assessment & Plan Note (Signed)
Symptoms have worsened.  We will go back to Effexor 75 mg daily.  She will be starting [place] soon as well.

## 2020-02-28 NOTE — Progress Notes (Signed)
   Brooke Clarke is a 59 y.o. female who presents today for an office visit.  Assessment/Plan:  New/Acute Problems: Urinary Frequency UA negative.  Will check urine culture to rule out UTI.  Could be overactive bladder.  If culture is negative would consider trial of oxybutynin/or referral to urology.  Itching Exam deferred.  Recommended topical capsaicin.  If no improvement will consider intralesional corticosteroid injection.  Chronic Problems Addressed Today: Depression, recurrent ([place]) Symptoms have worsened.  We will go back to Effexor 75 mg daily.  She will be starting [place] soon as well.  Hyperglycemia Check A1c.  Dyslipidemia Check lipid panel.  Morbid obesity ([place]) Discussed lifestyle modifications.  We will start Ozempic 0.25 mg weekly for 1 to 2 weeks then increase to 0.5 mg weekly.  She will follow-up with me in about 3 months.    Subjective:  HPI:  Patient has noticed more urinary frequency over the last few months.  She thought it is due to medications.  She stopped taking her Rybelsus but has not had significant improvement.  No dysuria.  No reported fevers or chills.  She also has an itchy spot on her back.  Skin is white for several years.  No rashes.  No exposures.  She will be getting [place] for her depression.       Objective:  Physical Exam: BP 131/84   Pulse 64   Temp (!) 97 F (36.1 C)   Ht 5\' 3"  (1.6 m)   Wt 233 lb (105.7 kg)   SpO2 97%   BMI 41.27 kg/m   Wt Readings from Last 3 Encounters:  02/28/20 233 lb (105.7 kg)  10/18/19 224 lb 3.2 oz (101.7 kg)  06/22/19 238 lb (108 kg)    Gen: No acute distress, resting comfortably CV: Regular rate and rhythm with no murmurs appreciated Pulm: Normal work of breathing, clear to auscultation bilaterally with no crackles, wheezes, or rhonchi Neuro: Grossly normal, moves all extremities Psych: Normal affect and thought content      Amarachi Kotz M. Jerline Pain, MD 02/28/2020 1:40 PM

## 2020-02-28 NOTE — Assessment & Plan Note (Signed)
Discussed lifestyle modifications.  We will start Ozempic 0.25 mg weekly for 1 to 2 weeks then increase to 0.5 mg weekly.  She will follow-up with me in about 3 months.

## 2020-02-28 NOTE — Assessment & Plan Note (Signed)
Check A1c. 

## 2020-02-29 LAB — URINE CULTURE
MICRO NUMBER:: 10721236
SPECIMEN QUALITY:: ADEQUATE

## 2020-03-01 ENCOUNTER — Encounter: Payer: Self-pay | Admitting: Family Medicine

## 2020-03-01 DIAGNOSIS — M9903 Segmental and somatic dysfunction of lumbar region: Secondary | ICD-10-CM | POA: Diagnosis not present

## 2020-03-01 DIAGNOSIS — M9901 Segmental and somatic dysfunction of cervical region: Secondary | ICD-10-CM | POA: Diagnosis not present

## 2020-03-01 DIAGNOSIS — M9902 Segmental and somatic dysfunction of thoracic region: Secondary | ICD-10-CM | POA: Diagnosis not present

## 2020-03-01 DIAGNOSIS — M7541 Impingement syndrome of right shoulder: Secondary | ICD-10-CM | POA: Diagnosis not present

## 2020-03-01 NOTE — Progress Notes (Signed)
Please inform patient of the following:  Cholesterol and blood sugar are both borderline. Do not need to make any changes to her medications but would like for her to continue to work on diet and exercise and we can recheck in a year or so. Her urine culture is negative - she does not have a UTI.   As we discussed at her office visit, we can try an overactive bladder medication or we can send her to see urology.  Brooke Clarke. Jerline Pain, MD 03/01/2020 8:04 AM

## 2020-03-02 ENCOUNTER — Other Ambulatory Visit: Payer: Self-pay

## 2020-03-02 MED ORDER — MIRABEGRON ER 25 MG PO TB24: 25.0000 mg | ORAL_TABLET | Freq: Every day | ORAL | 5 refills | Status: DC

## 2020-03-22 DIAGNOSIS — M9901 Segmental and somatic dysfunction of cervical region: Secondary | ICD-10-CM | POA: Diagnosis not present

## 2020-03-22 DIAGNOSIS — M7541 Impingement syndrome of right shoulder: Secondary | ICD-10-CM | POA: Diagnosis not present

## 2020-03-22 DIAGNOSIS — M9903 Segmental and somatic dysfunction of lumbar region: Secondary | ICD-10-CM | POA: Diagnosis not present

## 2020-03-22 DIAGNOSIS — M9902 Segmental and somatic dysfunction of thoracic region: Secondary | ICD-10-CM | POA: Diagnosis not present

## 2020-03-29 DIAGNOSIS — M9901 Segmental and somatic dysfunction of cervical region: Secondary | ICD-10-CM | POA: Diagnosis not present

## 2020-03-29 DIAGNOSIS — M9902 Segmental and somatic dysfunction of thoracic region: Secondary | ICD-10-CM | POA: Diagnosis not present

## 2020-03-29 DIAGNOSIS — M7541 Impingement syndrome of right shoulder: Secondary | ICD-10-CM | POA: Diagnosis not present

## 2020-03-29 DIAGNOSIS — M9903 Segmental and somatic dysfunction of lumbar region: Secondary | ICD-10-CM | POA: Diagnosis not present

## 2020-04-01 ENCOUNTER — Telehealth: Payer: Self-pay | Admitting: Physician Assistant

## 2020-04-01 NOTE — Telephone Encounter (Signed)
Called to discuss with patient about Covid symptoms and the use of casirivimab/imdevimab, a monoclonal antibody infusion for those with mild to moderate Covid symptoms and at a high risk of hospitalization.  Pt is qualified for this infusion at the [facility] infusion center due to; Specific high risk criteria : BMI > 25, DMT2  Message left to call back our hotline [phone] and sent a mychart message.  Angelena Form PA-C  [facility]

## 2020-04-12 DIAGNOSIS — M7541 Impingement syndrome of right shoulder: Secondary | ICD-10-CM | POA: Diagnosis not present

## 2020-04-12 DIAGNOSIS — M9903 Segmental and somatic dysfunction of lumbar region: Secondary | ICD-10-CM | POA: Diagnosis not present

## 2020-04-12 DIAGNOSIS — M9902 Segmental and somatic dysfunction of thoracic region: Secondary | ICD-10-CM | POA: Diagnosis not present

## 2020-04-12 DIAGNOSIS — M9901 Segmental and somatic dysfunction of cervical region: Secondary | ICD-10-CM | POA: Diagnosis not present

## 2020-04-24 ENCOUNTER — Encounter: Payer: Self-pay | Admitting: Family Medicine

## 2020-04-25 ENCOUNTER — Other Ambulatory Visit: Payer: Self-pay

## 2020-04-25 ENCOUNTER — Encounter: Payer: Self-pay | Admitting: Family Medicine

## 2020-04-25 ENCOUNTER — Telehealth: Payer: Self-pay | Admitting: *Deleted

## 2020-04-25 MED ORDER — MIRABEGRON ER 25 MG PO TB24: 25.0000 mg | ORAL_TABLET | Freq: Every day | ORAL | 5 refills | Status: DC

## 2020-04-25 MED ORDER — VENLAFAXINE HCL ER 75 MG PO CP24: 75.0000 mg | ORAL_CAPSULE | Freq: Every day | ORAL | 5 refills | Status: DC

## 2020-04-25 MED ORDER — OZEMPIC (0.25 OR 0.5 MG/DOSE) 2 MG/1.5ML ~~LOC~~ SOPN: 0.2500 mg | PEN_INJECTOR | SUBCUTANEOUS | 3 refills | Status: DC

## 2020-04-25 NOTE — Telephone Encounter (Signed)
Return patient call, stated [facility] do not have Ozempic in stock, patient asking for samples  Pt notified we have samples. Patient will pick up today

## 2020-04-26 NOTE — Telephone Encounter (Signed)
Rx sample Ozempic give it to patient yesterday

## 2020-05-04 DIAGNOSIS — G4733 Obstructive sleep apnea (adult) (pediatric): Secondary | ICD-10-CM | POA: Diagnosis not present

## 2020-05-31 DIAGNOSIS — M9901 Segmental and somatic dysfunction of cervical region: Secondary | ICD-10-CM | POA: Diagnosis not present

## 2020-05-31 DIAGNOSIS — M9903 Segmental and somatic dysfunction of lumbar region: Secondary | ICD-10-CM | POA: Diagnosis not present

## 2020-05-31 DIAGNOSIS — M7541 Impingement syndrome of right shoulder: Secondary | ICD-10-CM | POA: Diagnosis not present

## 2020-05-31 DIAGNOSIS — M9902 Segmental and somatic dysfunction of thoracic region: Secondary | ICD-10-CM | POA: Diagnosis not present

## 2020-06-20 ENCOUNTER — Encounter: Payer: Self-pay | Admitting: Family Medicine

## 2020-06-20 ENCOUNTER — Other Ambulatory Visit: Payer: Self-pay

## 2020-06-20 ENCOUNTER — Telehealth (INDEPENDENT_AMBULATORY_CARE_PROVIDER_SITE_OTHER): Payer: BC Managed Care – PPO | Admitting: Family Medicine

## 2020-06-20 DIAGNOSIS — R0981 Nasal congestion: Secondary | ICD-10-CM | POA: Diagnosis not present

## 2020-06-20 DIAGNOSIS — R059 Cough, unspecified: Secondary | ICD-10-CM

## 2020-06-20 MED ORDER — BENZONATATE 100 MG PO CAPS: 100.0000 mg | ORAL_CAPSULE | Freq: Three times a day (TID) | ORAL | 0 refills | Status: DC | PRN

## 2020-06-20 MED ORDER — ALBUTEROL SULFATE HFA 108 (90 BASE) MCG/ACT IN AERS: 2.0000 | INHALATION_SPRAY | Freq: Four times a day (QID) | RESPIRATORY_TRACT | 3 refills | Status: DC | PRN

## 2020-06-20 NOTE — Patient Instructions (Signed)
   It was nice to meet you today, and I really hope you are feeling better soon. I help [facility] out with telemedicine visits on Tuesdays and Thursdays and am available for visits on those days. If you have any concerns or questions following this visit please schedule a follow up visit with your Primary Care doctor or seek care at a local urgent care clinic to avoid delays in care.    Seek in person care promptly if your symptoms worsen, new concerns arise or you are not improving with treatment. Call 911 and/or seek emergency care if you symptoms are severe or life threatening.   -stay home while sick, and if you have [place] please stay home for a full 10 days since the onset of symptoms PLUS one day of no fever and feeling better  -[place] COVID19 testing information: [email or web address] OR [phone] Most pharmacies offer COVID-19 testing as well.  -I sent the medication(s) we discussed to your pharmacy: Meds ordered this encounter  Medications  . benzonatate (TESSALON PERLES) 100 MG capsule    Sig: Take 1 capsule (100 mg total) by mouth 3 (three) times daily as needed.    Dispense:  20 capsule    Refill:  0  . albuterol (VENTOLIN HFA) 108 (90 Base) MCG/ACT inhaler    Sig: Inhale 2 puffs into the lungs every 6 (six) hours as needed for wheezing or shortness of breath.    Dispense:  1 each    Refill:  3    -If COVID-19 testing is positive please follow-up with your primary care doctor regarding treatment.  -can use tylenol or aleve if needed for fevers, aches and pains per instructions  -can use nasal saline a few times per day if nasal congestion, sometime a short course of Afrin nasal spray for 3 days can help as well  -stay hydrated, drink plenty of fluids and eat small healthy meals - avoid dairy  -can take 1000 IU Vit D3 and Vit C lozenges per instructions  -follow up with your doctor in 2-3 days unless improving and feeling  better

## 2020-06-20 NOTE — Progress Notes (Signed)
Virtual Visit via Video Note  I connected with Kayani  on 06/20/20 at 11:40 AM EST by a video enabled telemedicine application and verified that I am speaking with the correct person using two identifiers.  Location patient: home, [place] Location provider:work or home office Persons participating in the virtual visit: patient, provider  I discussed the limitations of evaluation and management by telemedicine and the availability of in person appointments. The patient expressed understanding and agreed to proceed.   HPI:  Acute telemedicine visit for cough, congestion: -Onset: about 2 days ago -Symptoms include: cough, nasal congestion, more tired than usually, chills last night -Denies: fevers, CP, SOB, body aches, NVD -no known sick contacts -Has tried: nyquil, advil, cough drops  -Pertinent past medical history: denies diabetes - but listed in chart; reports usually requires inhalers once per year with with resp illness -Pertinent medication allergies: hydrocodone -COVID-19 vaccine status: not vaccinated  ROS: See pertinent positives and negatives per HPI.  Past Medical History:  Diagnosis Date  . Diabetes mellitus without complication ([place])   . History of breast augmentation 09/03/2018   Pateint with Hx of 2 breast surgeries. Now, with pain in neck and breasts due to size. Would like reduction. Will refer to breast surgeon.   Marland Kitchen HSV-2 (herpes simplex virus 2) infection 07/19/2011   Positive culture 07/18/11   . Sleep apnea     Past Surgical History:  Procedure Laterality Date  . CHOLECYSTECTOMY    . FOOT SURGERY     plantar fibromatosis  . KNEE SURGERY       Current Outpatient Medications:  .  albuterol (VENTOLIN HFA) 108 (90 Base) MCG/ACT inhaler, Inhale 2 puffs into the lungs every 6 (six) hours as needed for wheezing or shortness of breath., Disp: 1 each, Rfl: 3 .  aspirin 325 MG tablet, Take 325 mg by mouth 2 (two) times daily., Disp: , Rfl:  .  benzonatate (TESSALON  PERLES) 100 MG capsule, Take 1 capsule (100 mg total) by mouth 3 (three) times daily as needed., Disp: 20 capsule, Rfl: 0 .  Cholecalciferol (VITAMIN D3) 1.25 MG (50000 UT) CAPS, Take 1 capsule by mouth once a week., Disp: , Rfl:  .  mirabegron ER (MYRBETRIQ) 25 MG TB24 tablet, Take 1 tablet (25 mg total) by mouth daily., Disp: 30 tablet, Rfl: 5 .  Semaglutide (RYBELSUS) 14 MG TABS, Rybelsus 14 mg tablet  TAKE 1 TABLET BY MOUTH DAILY (Patient not taking: Reported on 02/28/2020), Disp: , Rfl:  .  Semaglutide,0.25 or 0.5MG /DOS, (OZEMPIC, 0.25 OR 0.5 MG/DOSE,) 2 MG/1.5ML SOPN, Inject 0.25 mg into the skin once a week., Disp: 1.5 mL, Rfl: 3 .  tretinoin (RETIN-A) 0.025 % cream, Apply topically at bedtime., Disp: 45 g, Rfl: 1 .  tretinoin (RETIN-A) 0.1 % cream, Apply topically at bedtime., Disp: 45 g, Rfl: 2 .  venlafaxine XR (EFFEXOR XR) 75 MG 24 hr capsule, Take 1 capsule (75 mg total) by mouth daily with breakfast., Disp: 30 capsule, Rfl: 5  EXAM:  VITALS per patient if applicable:  GENERAL: alert, oriented, appears well and in no acute distress  HEENT: atraumatic, conjunttiva clear, no obvious abnormalities on inspection of external nose and ears  NECK: normal movements of the head and neck  LUNGS: on inspection no signs of respiratory distress, breathing rate appears normal, no obvious gross SOB, gasping or wheezing  CV: no obvious cyanosis  MS: moves all visible extremities without noticeable abnormality  PSYCH/NEURO: pleasant and cooperative, no obvious depression or anxiety,  speech and thought processing grossly intact  ASSESSMENT AND PLAN:  Discussed the following assessment and plan:  Cough  Nasal congestion  -we discussed possible serious and likely etiologies, options for evaluation and workup, limitations of telemedicine visit vs in person visit, treatment, treatment risks and precautions. Pt prefers to treat via telemedicine empirically rather than in person at this moment.   Query viral upper respiratory illness, bronchitis, Covid versus other.  Advised Covid testing and also discussed options for influenza testing.  She seems to feel that this is not a flu or covid, but rather her usual yearly "bronchitis" and requests a cough medication and albuterol.  She preferred a narcotic cough medicine, however due to telemedicine protocols, I explained that I am not supposed to Rx this via telemedicine visits, and discussed risk and alternatives.  Sent Rx for Tessalon and refill of albuterol inhaler.  Summarized other care recommendations and patient handout. Work/School slipped offered: declined Scheduled follow up with PCP offered: Agrees to follow-up if needed. Advised to seek prompt in person care if worsening, new symptoms arise, or if is not improving with treatment. Discussed options for inperson care if PCP office not available.  Advised to schedule follow up visit with PCP or [facility] if any further questions or concerns following this visit.   I discussed the assessment and treatment plan with the patient. The patient was provided an opportunity to ask questions and all were answered. The patient agreed with the plan and demonstrated an understanding of the instructions.     Lucretia Kern, DO

## 2020-06-21 ENCOUNTER — Encounter: Payer: Self-pay | Admitting: Family Medicine

## 2020-06-21 MED ORDER — PREDNISONE 10 MG (21) PO TBPK: ORAL_TABLET | ORAL | 0 refills | Status: DC

## 2020-06-21 MED ORDER — HYDROCOD POLST-CPM POLST ER 10-8 MG/5ML PO SUER: 5.0000 mL | Freq: Two times a day (BID) | ORAL | 0 refills | Status: DC | PRN

## 2020-07-30 ENCOUNTER — Other Ambulatory Visit: Payer: Self-pay | Admitting: Family Medicine

## 2020-08-31 ENCOUNTER — Encounter: Payer: Self-pay | Admitting: Family Medicine

## 2020-08-31 DIAGNOSIS — Z96653 Presence of artificial knee joint, bilateral: Secondary | ICD-10-CM | POA: Diagnosis not present

## 2020-08-31 DIAGNOSIS — M7522 Bicipital tendinitis, left shoulder: Secondary | ICD-10-CM | POA: Diagnosis not present

## 2020-09-01 ENCOUNTER — Other Ambulatory Visit: Payer: Self-pay | Admitting: *Deleted

## 2020-09-01 MED ORDER — OXYBUTYNIN CHLORIDE ER 10 MG PO TB24: 10.0000 mg | ORAL_TABLET | Freq: Every day | ORAL | 1 refills | Status: DC

## 2020-09-12 ENCOUNTER — Encounter: Payer: Self-pay | Admitting: Family Medicine

## 2020-09-12 ENCOUNTER — Ambulatory Visit (INDEPENDENT_AMBULATORY_CARE_PROVIDER_SITE_OTHER): Payer: BC Managed Care – PPO | Admitting: Family Medicine

## 2020-09-12 ENCOUNTER — Ambulatory Visit (INDEPENDENT_AMBULATORY_CARE_PROVIDER_SITE_OTHER): Payer: BC Managed Care – PPO

## 2020-09-12 ENCOUNTER — Telehealth: Payer: Self-pay

## 2020-09-12 ENCOUNTER — Other Ambulatory Visit: Payer: Self-pay

## 2020-09-12 ENCOUNTER — Other Ambulatory Visit: Payer: Self-pay | Admitting: *Deleted

## 2020-09-12 ENCOUNTER — Emergency Department (HOSPITAL_COMMUNITY)
Admission: EM | Admit: 2020-09-12 | Discharge: 2020-09-12 | Discharge status: Against Medical Advice | Payer: BC Managed Care – PPO

## 2020-09-12 VITALS — BP 114/71 | HR 65 | Temp 97.9°F | Ht 63.0 in | Wt 240.6 lb

## 2020-09-12 DIAGNOSIS — L853 Xerosis cutis: Secondary | ICD-10-CM | POA: Insufficient documentation

## 2020-09-12 DIAGNOSIS — G4733 Obstructive sleep apnea (adult) (pediatric): Secondary | ICD-10-CM | POA: Diagnosis not present

## 2020-09-12 DIAGNOSIS — Z9989 Dependence on other enabling machines and devices: Secondary | ICD-10-CM

## 2020-09-12 DIAGNOSIS — R0602 Shortness of breath: Secondary | ICD-10-CM

## 2020-09-12 DIAGNOSIS — R739 Hyperglycemia, unspecified: Secondary | ICD-10-CM

## 2020-09-12 DIAGNOSIS — R079 Chest pain, unspecified: Secondary | ICD-10-CM

## 2020-09-12 DIAGNOSIS — L299 Pruritus, unspecified: Secondary | ICD-10-CM | POA: Diagnosis not present

## 2020-09-12 DIAGNOSIS — R7989 Other specified abnormal findings of blood chemistry: Secondary | ICD-10-CM

## 2020-09-12 LAB — CBC
HCT: 41.3 % (ref 36.0–46.0)
Hemoglobin: 14 g/dL (ref 12.0–15.0)
MCHC: 33.9 g/dL (ref 30.0–36.0)
MCV: 89.1 fl (ref 78.0–100.0)
Platelets: 195 10*3/uL (ref 150.0–400.0)
RBC: 4.64 Mil/uL (ref 3.87–5.11)
RDW: 14.2 % (ref 11.5–15.5)
WBC: 6.7 10*3/uL (ref 4.0–10.5)

## 2020-09-12 LAB — COMPREHENSIVE METABOLIC PANEL
ALT: 26 U/L (ref 0–35)
AST: 20 U/L (ref 0–37)
Albumin: 4.1 g/dL (ref 3.5–5.2)
Alkaline Phosphatase: 86 U/L (ref 39–117)
BUN: 17 mg/dL (ref 6–23)
CO2: 25 mEq/L (ref 19–32)
Calcium: 9.5 mg/dL (ref 8.4–10.5)
Chloride: 106 mEq/L (ref 96–112)
Creatinine, Ser: 0.74 mg/dL (ref 0.40–1.20)
GFR: 87.23 mL/min (ref >60.00)
Glucose, Bld: 91 mg/dL (ref 70–99)
Potassium: 4 mEq/L (ref 3.5–5.1)
Sodium: 139 mEq/L (ref 135–145)
Total Bilirubin: 0.3 mg/dL (ref 0.2–1.2)
Total Protein: 6.9 g/dL (ref 6.0–8.3)

## 2020-09-12 LAB — TSH: TSH: 2.31 u[IU]/mL (ref 0.35–4.50)

## 2020-09-12 LAB — D-DIMER, QUANTITATIVE: D-Dimer, Quant: 3.75 mcg/mL FEU — ABNORMAL HIGH (ref <0.50)

## 2020-09-12 MED ORDER — FREESTYLE LIBRE 14 DAY SENSOR MISC: 99 refills | Status: DC

## 2020-09-12 MED ORDER — CLOBETASOL PROPIONATE 0.05 % EX CREA: 1.0000 "application " | TOPICAL_CREAM | Freq: Two times a day (BID) | CUTANEOUS | 0 refills | Status: DC

## 2020-09-12 MED ORDER — FREESTYLE LIBRE 14 DAY READER DEVI: 99 refills | Status: DC

## 2020-09-12 NOTE — Telephone Encounter (Signed)
Nurse Assessment Nurse: Hilary Hertz, RN, Louanne Belton Date/Time (Eastern Time): 09/12/2020 5:17:10 PM Confirm and document reason for call. If symptomatic, describe symptoms. ---Caller states she Dr. Jerline Pain this morning for SOB. Pt received call from [facility], the nurse- her D-Dimer from her blood work was positive and elevated and instructed to go to ER. Caller wondering which one should she go to in [place]. Does the patient have any new or worsening symptoms? ---Yes Will a triage be completed? ---Yes Related visit to physician within the last 2 weeks? ---Yes Does the PT have any chronic conditions? (i.e. diabetes, asthma, this includes High risk factors for pregnancy, etc.) ---Yes List chronic conditions. ---bronchitis, pre DM Is this a behavioral health or substance abuse call? ---No Guidelines Guideline Title Affirmed Question Affirmed Notes Nurse Date/Time (Eastern Time) Breathing Difficulty History of inherited increased risk of blood clots (e.g., Factor 5 Leiden, Anti-thrombin 3, Protein C or Protein S deficiency, Prothrombin mutation) Hilary Hertz, RN, Louanne Belton 09/12/2020 5:20:12 PM Disp. Time Eilene Ghazi Time) Disposition Final User 09/12/2020 5:23:55 PM Go to ED Now Yes Hilary Hertz, RN, Louanne Belton PLEASE NOTE: All timestamps contained within this report are represented as [place] Standard Time. CONFIDENTIALTY NOTICE: This fax transmission is intended only for the addressee. It contains information that is legally privileged, confidential or otherwise protected from use or disclosure. If you are not the intended recipient, you are strictly prohibited from reviewing, disclosing, copying using or disseminating any of this information or taking any action in reliance on or regarding this information. If you have received this fax in error, please notify us immediately by telephone so that we can arrange for its return to [place]. Phone: [phone], Toll-Free: [phone],  Fax: [phone] Page: 2 of 2 Call Id: [id number] [place] Disagree/Comply Comply Caller Understands Yes PreDisposition Call Doctor Care Advice Given Per Guideline GO TO ED NOW: * You need to be seen in the Emergency Department. * Go to the ED at ___________ [place] now. Drive carefully. CARE ADVICE given per Breathing Difficulty (Adult) guideline. * Bring a list of your current medicines when you go to the Emergency Department (ER). * Call EMS if you become worse. CALL EMS 911 IF: Comments User: Louanne Belton, Hilary Hertz, RN Date/Time Eilene Ghazi Time): 09/12/2020 5:21:54 PM Pt complaining of SOB with walking, walks regularily. Symptoms started on Thanksgiving day, has been happening randomly. Referrals [facility] - ED

## 2020-09-12 NOTE — Progress Notes (Addendum)
   Brooke Clarke is a 60 y.o. female who presents today for an office visit.  Assessment/Plan:  New/Acute Problems: Dyspnea on Exertion Reassuring EKG.  Normal exam today.  She had a stress test a little over a year ago which was normal.  Doubt this represents cardiac etiology given recent normal stress test.  Wells score 0 - doubt PE. Will check labs today including CBC, CMET, and D-dimer.  Also check chest x-ray.  If work-up is normal may need referral to pulmonology for PFTs.  May also consider CT scan.  Discussed reasons to return to care or seek emergent care.  Pruritic ear canal Likely secondary to dry skin.  Recommended use of small amount of over-the-counter cortisone cream as needed  Chronic Problems Addressed Today: Xerosis cutis Xerosis cutis noted on back.  Likely source of her pruritus.  Recommended emollients.  Also start topical betazole given her degree of pruritus.  She may also have formation of picker's nodules.  May consider intralesional steroid injection if not improving.  Hyperglycemia Last A1c 5.8.  Discussed lifestyle medications.  We'll send in prescription for glucometer per patient request.  Morbid obesity ([place]) Will place referral to weight management per patient request.  OSA on CPAP Patient states her CPAP machine is no longer functioning properly.  Will place referral to sleep medicine.     Subjective:  HPI:  Patient here with several issues that she would like to discuss today.  Most pressing concern at this point is that she has had exertional dyspnea for the last 2 months.  Symptoms seem to be progressively worsening.  She does not have any shortness of breath at rest however whenever she walks for a moderate distance she becomes very winded and short of breath.  No chest pain.  No pressure.  No dizziness.  No palpitations.  No cough or wheeze.  No fevers or chills.  No recent illnesses.  See A/P for status for other chronic conditions.  She has had  more itchy ear canals.  She has not tried anything for this.  She also has an itchy spot on her back.  She tried using topical capsaicin which did not help.       Objective:  Physical Exam: BP 114/71   Pulse 65   Temp 97.9 F (36.6 C) (Temporal)   Ht 5\' 3"  (1.6 m)   Wt 240 lb 9.6 oz (109.1 kg)   SpO2 98%   BMI 42.62 kg/m   Gen: No acute distress, resting comfortably HEENT: Bilateral EAC with dry skin and erythema noted. CV: Regular rate and rhythm with no murmurs appreciated Pulm: Normal work of breathing, clear to auscultation bilaterally with no crackles, wheezes, or rhonchi Skin: Xerosis cutis on back Neuro: Grossly normal, moves all extremities Psych: Normal affect and thought content  EKG: Normal sinus rhythm.  No acute ischemic changes  Time Spent: 46 minutes of total time was spent on the date of the encounter performing the following actions: chart review prior to seeing the patient, obtaining history, performing a medically necessary exam, counseling on the treatment plan including labs and xray, placing orders, and documenting in our EHR.        Algis Greenhouse. Jerline Pain, MD 09/12/2020 11:50 AM

## 2020-09-12 NOTE — Assessment & Plan Note (Signed)
Patient states her CPAP machine is no longer functioning properly.  Will place referral to sleep medicine.

## 2020-09-12 NOTE — Patient Instructions (Addendum)
It was very nice to see you today!  We will check blood work and a chest xray.  Your EKG looks good and your stress test was normal last year.  I do not think this is coming from your heart.  Please try using with betazole for the itchy spot on your back.  You can also try using a bit of cortisone cream on your ear.  I will place a referral for you to see the weight management clinic.  I'll also place a referral for you to see our sleep doctor.  I will send in a prescription for glucose meter.  We'll contact you once we get results back. Please let us know if your symptoms worsen.   Take care, Dr Jerline Pain  Please try these tips to maintain a healthy lifestyle:   Eat at least 3 REAL meals and 1-2 snacks per day.  Aim for no more than 5 hours between eating.  If you eat breakfast, please do so within one hour of getting up.    Each meal should contain half fruits/vegetables, one quarter protein, and one quarter carbs (no bigger than a computer mouse)   Cut down on sweet beverages. This includes juice, soda, and sweet tea.     Drink at least 1 glass of water with each meal and aim for at least 8 glasses per day   Exercise at least 150 minutes every week.

## 2020-09-12 NOTE — Assessment & Plan Note (Signed)
Last A1c 5.8.  Discussed lifestyle medications.  We'll send in prescription for glucometer per patient request.

## 2020-09-12 NOTE — Progress Notes (Signed)
Please inform patient of the following:  Her D-dimer is positive. This means should could have a blood clot in her lungs causing her symptoms. We need to get a CTA ASAP. If we can not get it done this afternoon then she needs to go to the ED.  Brooke Clarke. Jerline Pain, MD 09/12/2020 3:40 PM

## 2020-09-12 NOTE — ED Notes (Signed)
Pt states she is not going to wait, she will contact her PCP bout this

## 2020-09-12 NOTE — Assessment & Plan Note (Signed)
Will place referral to weight management per patient request.

## 2020-09-12 NOTE — Assessment & Plan Note (Signed)
Xerosis cutis noted on back.  Likely source of her pruritus.  Recommended emollients.  Also start topical betazole given her degree of pruritus.  She may also have formation of picker's nodules.  May consider intralesional steroid injection if not improving.

## 2020-09-13 ENCOUNTER — Encounter: Payer: Self-pay | Admitting: Family Medicine

## 2020-09-13 ENCOUNTER — Other Ambulatory Visit: Payer: Self-pay | Admitting: Family Medicine

## 2020-09-13 ENCOUNTER — Ambulatory Visit (HOSPITAL_BASED_OUTPATIENT_CLINIC_OR_DEPARTMENT_OTHER)
Admission: RE | Admit: 2020-09-13 | Discharge: 2020-09-13 | Disposition: A | Discharge status: Home / Self Care | Payer: BC Managed Care – PPO | Source: Ambulatory Visit | Attending: Family Medicine | Admitting: Family Medicine

## 2020-09-13 ENCOUNTER — Other Ambulatory Visit: Payer: Self-pay | Admitting: *Deleted

## 2020-09-13 DIAGNOSIS — R7989 Other specified abnormal findings of blood chemistry: Secondary | ICD-10-CM

## 2020-09-13 DIAGNOSIS — R079 Chest pain, unspecified: Secondary | ICD-10-CM

## 2020-09-13 DIAGNOSIS — R0602 Shortness of breath: Secondary | ICD-10-CM

## 2020-09-13 MED ORDER — IOHEXOL 350 MG/ML SOLN
100.0000 mL | Freq: Once | INTRAVENOUS | Status: AC | PRN
  Administered 2020-09-13: 100 mL via INTRAVENOUS

## 2020-09-13 NOTE — Telephone Encounter (Signed)
Pt has OV today

## 2020-09-13 NOTE — Telephone Encounter (Signed)
Pt notified CTA results  1. No evidence for acute pulmonary embolus. 2. No acute cardiopulmonary abnormalities noted. Pulmonologist referral placed, ok per DR Jerline Pain

## 2020-09-13 NOTE — Progress Notes (Signed)
Please inform patient of the following:  Her xray and the rest of her blood work is all NORMAL. I am concerned about a possible blood clot in her lungs. We need to get her CT scan to confirm this.  Algis Greenhouse. Jerline Pain, MD 09/13/2020 8:14 AM

## 2020-09-13 NOTE — Telephone Encounter (Signed)
See notes

## 2020-09-13 NOTE — Telephone Encounter (Signed)
See previews note 

## 2020-09-14 NOTE — Progress Notes (Signed)
Please inform patient of the following:  Good news is her CTA was negative for a blood clot. Still do not have a clear explanation for her shortness of breath. Recommend referral to pulmonology - I believe Junious Dresser has already done this.  Algis Greenhouse. Jerline Pain, MD 09/14/2020 8:15 AM

## 2020-09-19 ENCOUNTER — Encounter: Payer: Self-pay | Admitting: Family Medicine

## 2020-09-20 NOTE — Telephone Encounter (Signed)
Please check on this referral thanks

## 2020-10-08 ENCOUNTER — Other Ambulatory Visit: Payer: Self-pay | Admitting: Family Medicine

## 2020-11-01 DIAGNOSIS — G4733 Obstructive sleep apnea (adult) (pediatric): Secondary | ICD-10-CM | POA: Diagnosis not present

## 2020-11-06 ENCOUNTER — Encounter: Payer: Self-pay | Admitting: Family Medicine

## 2020-11-06 ENCOUNTER — Other Ambulatory Visit: Payer: Self-pay | Admitting: *Deleted

## 2020-11-06 MED ORDER — VENLAFAXINE HCL ER 75 MG PO CP24: 75.0000 mg | ORAL_CAPSULE | Freq: Every day | ORAL | 5 refills | Status: DC

## 2020-11-06 MED ORDER — OZEMPIC (0.25 OR 0.5 MG/DOSE) 2 MG/1.5ML ~~LOC~~ SOPN: PEN_INJECTOR | SUBCUTANEOUS | 3 refills | Status: DC

## 2020-11-08 ENCOUNTER — Encounter: Payer: Self-pay | Admitting: Family Medicine

## 2020-11-08 NOTE — Telephone Encounter (Signed)
See note  Insurance will not cover if not diabetic  Please advise

## 2020-11-09 ENCOUNTER — Encounter: Payer: Self-pay | Admitting: Family Medicine

## 2020-11-09 NOTE — Telephone Encounter (Signed)
Please advise 

## 2020-12-07 ENCOUNTER — Other Ambulatory Visit: Payer: Self-pay

## 2020-12-07 ENCOUNTER — Ambulatory Visit (INDEPENDENT_AMBULATORY_CARE_PROVIDER_SITE_OTHER): Payer: BC Managed Care – PPO | Admitting: Family Medicine

## 2020-12-07 ENCOUNTER — Encounter (INDEPENDENT_AMBULATORY_CARE_PROVIDER_SITE_OTHER): Payer: Self-pay | Admitting: Family Medicine

## 2020-12-07 VITALS — BP 138/76 | HR 57 | Temp 97.6°F | Ht 63.0 in | Wt 244.0 lb

## 2020-12-07 DIAGNOSIS — E7849 Other hyperlipidemia: Secondary | ICD-10-CM

## 2020-12-07 DIAGNOSIS — R5383 Other fatigue: Secondary | ICD-10-CM | POA: Diagnosis not present

## 2020-12-07 DIAGNOSIS — Z0289 Encounter for other administrative examinations: Secondary | ICD-10-CM

## 2020-12-07 DIAGNOSIS — R0602 Shortness of breath: Secondary | ICD-10-CM | POA: Diagnosis not present

## 2020-12-07 DIAGNOSIS — R030 Elevated blood-pressure reading, without diagnosis of hypertension: Secondary | ICD-10-CM

## 2020-12-07 DIAGNOSIS — Z6841 Body Mass Index (BMI) 40.0 and over, adult: Secondary | ICD-10-CM

## 2020-12-07 DIAGNOSIS — R7303 Prediabetes: Secondary | ICD-10-CM

## 2020-12-07 DIAGNOSIS — F3289 Other specified depressive episodes: Secondary | ICD-10-CM

## 2020-12-07 DIAGNOSIS — E559 Vitamin D deficiency, unspecified: Secondary | ICD-10-CM | POA: Diagnosis not present

## 2020-12-07 DIAGNOSIS — G4733 Obstructive sleep apnea (adult) (pediatric): Secondary | ICD-10-CM

## 2020-12-07 DIAGNOSIS — Z9989 Dependence on other enabling machines and devices: Secondary | ICD-10-CM

## 2020-12-07 DIAGNOSIS — Z9189 Other specified personal risk factors, not elsewhere classified: Secondary | ICD-10-CM

## 2020-12-07 DIAGNOSIS — F39 Unspecified mood [affective] disorder: Secondary | ICD-10-CM

## 2020-12-08 LAB — INSULIN, RANDOM: INSULIN: 17.3 u[IU]/mL (ref 2.6–24.9)

## 2020-12-08 LAB — VITAMIN B12: Vitamin B-12: 376 pg/mL (ref 232–1245)

## 2020-12-08 LAB — LIPID PANEL
Chol/HDL Ratio: 5.2 ratio — ABNORMAL HIGH (ref 0.0–4.4)
Cholesterol, Total: 253 mg/dL — ABNORMAL HIGH (ref 100–199)
HDL: 49 mg/dL (ref >39)
LDL Chol Calc (NIH): 178 mg/dL — ABNORMAL HIGH (ref 0–99)
Triglycerides: 145 mg/dL (ref 0–149)
VLDL Cholesterol Cal: 26 mg/dL (ref 5–40)

## 2020-12-08 LAB — HEMOGLOBIN A1C
Est. average glucose Bld gHb Est-mCnc: 120 mg/dL
Hgb A1c MFr Bld: 5.8 % — ABNORMAL HIGH (ref 4.8–5.6)

## 2020-12-08 LAB — FOLATE: Folate: 7.5 ng/mL (ref >3.0)

## 2020-12-08 LAB — VITAMIN D 25 HYDROXY (VIT D DEFICIENCY, FRACTURES): Vit D, 25-Hydroxy: 28.3 ng/mL — ABNORMAL LOW (ref 30.0–100.0)

## 2020-12-11 ENCOUNTER — Other Ambulatory Visit: Payer: Self-pay

## 2020-12-11 ENCOUNTER — Encounter: Payer: Self-pay | Admitting: Family Medicine

## 2020-12-11 DIAGNOSIS — Z9989 Dependence on other enabling machines and devices: Secondary | ICD-10-CM

## 2020-12-11 DIAGNOSIS — G4733 Obstructive sleep apnea (adult) (pediatric): Secondary | ICD-10-CM

## 2020-12-12 ENCOUNTER — Encounter: Payer: Self-pay | Admitting: Pulmonary Disease

## 2020-12-14 NOTE — Progress Notes (Signed)
Dear Dr. Jerline Pain,   Thank you for referring Brooke Clarke to our clinic. The following note includes my evaluation and treatment recommendations.  Chief Complaint:   Brooke Clarke (MR# [id number]) is a 60 y.o. female who presents for evaluation and treatment of Brooke and related comorbidities. Current BMI is Body mass index is 43.22 kg/m. Brooke Clarke has been struggling with her weight for many years and has been unsuccessful in either losing weight, maintaining weight loss, or reaching her healthy weight goal.  Brooke Clarke is currently in the action stage of change and ready to dedicate time achieving and maintaining a healthier weight. Brooke Clarke is interested in becoming our patient and working on intensive lifestyle modifications including (but not limited to) diet and exercise for weight loss.  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  Lives with her husband, Brooke Clarke, and has 2 grown children.  Eats out 5-6 times per week, fast food.  Craves sugar, especially at night.  Snacks on ice cream, chips.  Skips breakfast or lunch.  "Pills helped me lose 50 pounds but I put it back on once I stopped them."  Has never been on a diet/restricted meal plan.    Brooke Clarke's habits were reviewed today and are as follows: Her family eats meals together, she struggles with family and or coworkers weight loss sabotage, her desired weight loss is 100 pounds, she has been heavy most of her life, she started gaining weight after having children, her heaviest weight ever was 240 pounds, she is a picky eater and doesn't like to eat healthier foods, she craves sugar, she snacks frequently in the evenings, she skips breakfast frequently, she is frequently drinking liquids with calories, she frequently makes poor food choices, she frequently eats larger portions than normal and she struggles with emotional eating.  Depression Screen Brooke Clarke's Food and Mood (modified PHQ-9) score was 14.  Depression screen Brooke Clarke 2/9  12/07/2020  Decreased Interest 3  Down, Depressed, Hopeless 3  PHQ - 2 Score 6  Altered sleeping 1  Tired, decreased energy 2  Change in appetite 1  Feeling bad or failure about yourself  3  Trouble concentrating 1  Moving slowly or fidgety/restless 0  Suicidal thoughts 0  PHQ-9 Score 14  Difficult doing work/chores Not difficult at all   Assessment/Plan:   1. Other fatigue Brooke Clarke denies daytime somnolence and reports waking up still tired. Patent has a history of symptoms of morning fatigue. Brooke Clarke generally gets 9 hours of sleep per night, and states that she has generally restful sleep. Snoring is not present. Apneic episodes are present. Epworth Sleepiness Score is 2.  Brooke Clarke does feel that her weight is causing her energy to be lower than it should be. Fatigue may be related to Brooke, depression or many other causes. Labs will be ordered, and in the meanwhile, Brooke Clarke will focus on self care including making healthy food choices, increasing physical activity and focusing on stress reduction.  Check labs today.  - Vitamin B12 - Hemoglobin A1c - Folate - Insulin, random - Lipid panel - VITAMIN D 25 Hydroxy (Vit-D Deficiency, Fractures)  2. SOBOE (shortness of breath on exertion) Brooke Clarke notes increasing shortness of breath with exercising and seems to be worsening over time with weight gain. She notes getting out of breath sooner with activity than she used to. This has gotten worse recently. Brooke Clarke denies shortness of breath at rest or orthopnea.  Airiel does feel that she gets out of  breath more easily that she used to when she exercises. Brooke Clarke's shortness of breath appears to be Brooke related and exercise induced. She has agreed to work on weight loss and gradually increase exercise to treat her exercise induced shortness of breath. Will continue to monitor closely.  Check IC and labs today.  - Vitamin B12 - Hemoglobin A1c - Folate - Insulin, random - Lipid panel  3.  Prediabetes Not at goal. Goal is HgbA1c < 5.7.  Medication: None.  She was on Rybelsus in the past, but says that the price recently went up and she stopped it about 1 month ago.  She says it did not help at all with hunger or weight loss.  Taking Ozempic.  Plan:  She will continue to focus on protein-rich, low simple carbohydrate foods. We reviewed the importance of hydration, regular exercise for stress reduction, and restorative sleep.  Will check A1c today.  Lab Results  Component Value Date   HGBA1C 5.8 (H) 12/07/2020   Lab Results  Component Value Date   INSULIN 17.3 12/07/2020   - Hemoglobin A1c  4. Other hyperlipidemia Course: Not at goal. Lipid-lowering medications: None.   Plan: Dietary changes: Increase soluble fiber, decrease simple carbohydrates, decrease saturated fat. Exercise changes: Moderate to vigorous-intensity aerobic activity 150 minutes per week or as tolerated. We will continue to monitor along with PCP/specialists as it pertains to her weight loss journey.  Will check labs today.  Lab Results  Component Value Date   CHOL 253 (H) 12/07/2020   HDL 49 12/07/2020   LDLCALC 178 (H) 12/07/2020   LDLDIRECT 165.0 05/12/2019   TRIG 145 12/07/2020   CHOLHDL 5.2 (H) 12/07/2020   Lab Results  Component Value Date   ALT 26 09/12/2020   AST 20 09/12/2020   ALKPHOS 86 09/12/2020   BILITOT 0.3 09/12/2020   The 10-year ASCVD risk score [name], et al., 2013) is: 5.4%   Values used to calculate the score:     Age: 41 years     Sex: Female     Is Non-Hispanic African American: No     Diabetic: No     Tobacco smoker: No     Systolic Blood Pressure: 403 mmHg     Is BP treated: No     HDL Cholesterol: 49 mg/dL     Total Cholesterol: 253 mg/dL  - Vitamin B12 - Folate - Insulin, random - Lipid panel  5. Vitamin D deficiency Not at goal. Current vitamin D is 21.79, tested on 05/12/2019. Optimal goal > 50 ng/dL.  She is taking an OTC vitamin D supplement.  She  believes it is 5,000 IU daily.   Plan: Continue to OTC vitamin D daily.  Will check vitamin D level today, as per below.  - VITAMIN D 25 Hydroxy (Vit-D Deficiency, Fractures)  6. OSA on CPAP On CPAP for 3-4 years now.  Not sure mask fits right or that CPAP machine pressures are correct.  Plan:  She will contact [facility] regarding referral to Sleep Medicine as she does not want to return to [facility].  OSA is a cause of systemic hypertension and is associated with an increased incidence of stroke, heart failure, atrial fibrillation, and coronary heart disease. Severe OSA increases all-cause mortality and  cardiovascular mortality.   Goal: Treatment of OSA via CPAP compliance and weight loss. . Plasma ghrelin levels (appetite or "hunger hormone") are significantly higher in OSA patients than in BMI-matched controls, but decrease to levels similar  to those of obese patients without OSA after CPAP treatment.  . Weight loss improves OSA by several mechanisms, including reduction in fatty tissue in the throat (i.e. parapharyngeal fat) and the tongue. Loss of abdominal fat increases mediastinal traction on the upper airway making it less likely to collapse during sleep. . Studies have also shown that compliance with CPAP treatment improves leptin (hunger inhibitory hormone) imbalance.  7. Elevated blood pressure reading History of hypertension.  She says she has never been told it was a problem in the past.  Plan:  Check labs today.  BP Readings from Last 3 Encounters:  12/07/20 138/76  09/12/20 114/71  02/28/20 131/84   Lab Results  Component Value Date   CREATININE 0.74 09/12/2020   - Hemoglobin A1c - Lipid panel  8. Other depression, with emotional eating Controlled. Medication: Effexor XR 75 mg daily.  With depression/stress eating.  PHQ-9 is 14.  Denies this is a current issue for her and tells me she feels that her mood is stable and she has a church support group.   Plan:  Check labs.  She  will let us know in the future if she changes her mind about Dr. Mallie Mussel referral.  Continue with church support group.  Behavior modification techniques were discussed today to help deal with emotional/non-hunger eating behaviors.  - Vitamin B12 - Folate - Insulin, random - Lipid panel  9. At risk for heart disease Due to Tiaria's current state of health and medical condition(s), she is at a higher risk for heart disease.  This puts the patient at much greater risk to subsequently develop cardiopulmonary conditions that can significantly affect patient's quality of life in a negative manner.    At least 23 minutes were spent on counseling Brooke Clarke about these concerns today, and I stressed the importance of reversing risks factors of Brooke, especially truncal and visceral fat, hypertension, hyperlipidemia, and pre-diabetes.  The initial goal is to lose at least 5-10% of starting weight to help reduce these risk factors.  Counseling:  Intensive lifestyle modifications were discussed with Mardella as the most appropriate first line of treatment.  she will continue to work on diet, exercise, and weight loss efforts.  We will continue to reassess these conditions on a fairly regular basis in an attempt to decrease the patient's overall morbidity and mortality.  Evidence-based interventions for health behavior change were utilized today including the discussion of self monitoring techniques, problem-solving barriers, and SMART goal setting techniques.  Specifically, regarding patient's less desirable eating habits and patterns, we employed the technique of small changes when Arlen has not been able to fully commit to her prudent nutritional plan.  10. Class 3 severe Brooke with serious comorbidity and body mass index (BMI) of 40.0 to 44.9 in adult, unspecified Brooke type (HCC)  Brooke Clarke is currently in the action stage of change and her goal is to continue with weight loss efforts. I recommend Brooke Clarke begin the  structured treatment plan as follows:  She has agreed to the Category 3 Plan.  Exercise goals: As is.   Behavioral modification strategies: increasing lean protein intake, decreasing simple carbohydrates, decreasing eating out, no skipping meals and planning for success.  She was informed of the importance of frequent follow-up visits to maximize her success with intensive lifestyle modifications for her multiple health conditions. She was informed we would discuss her lab results at her next visit unless there is a critical issue that needs to be addressed sooner. Kaycee agreed to  keep her next visit at the agreed upon time to discuss these results.  Objective:   Blood pressure 138/76, pulse (!) 57, temperature 97.6 F (36.4 C), height 5\' 3"  (1.6 m), weight 244 lb (110.7 kg), SpO2 98 %. Body mass index is 43.22 kg/m.  302Indirect Calorimeter completed today shows a VO2 of 302 and a REE of 2102.  Her calculated basal metabolic rate is 1751 thus her basal metabolic rate is better than expected.  General: Cooperative, alert, well developed, in no acute distress. HEENT: Conjunctivae and lids unremarkable. Cardiovascular: Regular rhythm.  Lungs: Normal work of breathing. Neurologic: No focal deficits.   Lab Results  Component Value Date   CREATININE 0.74 09/12/2020   BUN 17 09/12/2020   NA 139 09/12/2020   K 4.0 09/12/2020   CL 106 09/12/2020   CO2 25 09/12/2020   Lab Results  Component Value Date   ALT 26 09/12/2020   AST 20 09/12/2020   ALKPHOS 86 09/12/2020   BILITOT 0.3 09/12/2020   Lab Results  Component Value Date   HGBA1C 5.8 (H) 12/07/2020   HGBA1C 5.8 02/28/2020   HGBA1C 6.0 05/12/2019   HGBA1C 5.4 06/03/2018   HGBA1C 5.9 03/04/2018   Lab Results  Component Value Date   INSULIN 17.3 12/07/2020   Lab Results  Component Value Date   TSH 2.31 09/12/2020   Lab Results  Component Value Date   CHOL 253 (H) 12/07/2020   HDL 49 12/07/2020   LDLCALC 178 (H)  12/07/2020   LDLDIRECT 165.0 05/12/2019   TRIG 145 12/07/2020   CHOLHDL 5.2 (H) 12/07/2020   Lab Results  Component Value Date   WBC 6.7 09/12/2020   HGB 14.0 09/12/2020   HCT 41.3 09/12/2020   MCV 89.1 09/12/2020   PLT 195.0 09/12/2020   Lab Results  Component Value Date   IRON 67 03/04/2018   TIBC 360 03/04/2018   FERRITIN 109 03/04/2018   Attestation Statements:   This is the patient's first visit at [facility]. The patient's NEW PATIENT PACKET was reviewed at length. Included in the packet: current and past health history, medications, allergies, ROS, gynecologic history (women only), surgical history, family history, social history, weight history, weight loss surgery history (for those that have had weight loss surgery), nutritional evaluation, mood and food questionnaire, PHQ9, Epworth questionnaire, sleep habits questionnaire, patient life and health improvement goals questionnaire. These will all be scanned into the patient's chart under media.   During the visit, I independently reviewed the patient's EKG, bioimpedance scale results, and indirect calorimeter results. I used this information to tailor a meal plan for the patient that will help her to lose weight and will improve her Brooke-related conditions going forward. I performed a medically necessary appropriate examination and/or evaluation. I discussed the assessment and treatment plan with the patient. The patient was provided an opportunity to ask questions and all were answered. The patient agreed with the plan and demonstrated an understanding of the instructions. Labs were ordered at this visit and will be reviewed at the next visit unless more critical results need to be addressed immediately. Clinical information was updated and documented in the EMR.   I, Water quality scientist, CMA, am acting as Location manager for [employer], DO.  I have reviewed the above documentation for accuracy and completeness,  and I agree with the above. Marjory Sneddon, D.O.  The [place] was signed into law in 2016 which includes the topic of electronic  health records.  This provides immediate access to information in MyChart.  This includes consultation notes, operative notes, office notes, lab results and pathology reports.  If you have any questions about what you read please let us know at your next visit so we can discuss your concerns and take corrective action if need be.  We are right here with you.

## 2020-12-19 ENCOUNTER — Encounter: Payer: Self-pay | Admitting: Family Medicine

## 2020-12-21 ENCOUNTER — Other Ambulatory Visit: Payer: Self-pay

## 2020-12-21 ENCOUNTER — Encounter: Payer: Self-pay | Admitting: Family Medicine

## 2020-12-21 ENCOUNTER — Ambulatory Visit (INDEPENDENT_AMBULATORY_CARE_PROVIDER_SITE_OTHER): Payer: BC Managed Care – PPO | Admitting: Family Medicine

## 2020-12-21 ENCOUNTER — Encounter (INDEPENDENT_AMBULATORY_CARE_PROVIDER_SITE_OTHER): Payer: Self-pay | Admitting: Family Medicine

## 2020-12-21 VITALS — BP 133/74 | HR 65 | Temp 97.5°F | Ht 63.0 in | Wt 241.0 lb

## 2020-12-21 DIAGNOSIS — R7303 Prediabetes: Secondary | ICD-10-CM

## 2020-12-21 DIAGNOSIS — Z6841 Body Mass Index (BMI) 40.0 and over, adult: Secondary | ICD-10-CM

## 2020-12-21 DIAGNOSIS — E559 Vitamin D deficiency, unspecified: Secondary | ICD-10-CM | POA: Diagnosis not present

## 2020-12-21 DIAGNOSIS — E7849 Other hyperlipidemia: Secondary | ICD-10-CM | POA: Diagnosis not present

## 2020-12-21 MED ORDER — METFORMIN HCL 500 MG PO TABS: ORAL_TABLET | ORAL | 0 refills | Status: DC

## 2020-12-21 MED ORDER — VITAMIN D (ERGOCALCIFEROL) 1.25 MG (50000 UNIT) PO CAPS: 50000.0000 [IU] | ORAL_CAPSULE | ORAL | 0 refills | Status: DC

## 2020-12-21 NOTE — Telephone Encounter (Signed)
Please advise 

## 2020-12-26 ENCOUNTER — Encounter: Payer: Self-pay | Admitting: Family Medicine

## 2020-12-26 NOTE — Telephone Encounter (Signed)
Please advice  

## 2020-12-28 NOTE — Progress Notes (Signed)
Chief Complaint:   OBESITY Brooke Clarke is here to discuss her progress with her obesity treatment plan along with follow-up of her obesity related diagnoses.   Today's visit was #: 2 Starting weight: 244 lbs Starting date: 12/07/2020 Today's weight: 241 lbs Today's date: 12/21/2020 Weight change since last visit: 3 lbs Total lbs lost to date: 3 lbs Body mass index is 42.69 kg/m.  Total weight loss percentage to date: -1.23%  Brooke Clarke:  Brooke Clarke is here today for her first follow-up office visit since starting the program with [place].  Brooke Clarke/ lab tests that were recently ordered by myself or an outside provider were reviewed with patient today per their request.   Extended time was spent counseling her on Brooke new disease processes that were discovered or preexisting ones that are worsening.  she understands that many of these abnormalities will need to monitored regularly along with the current treatment plan of prudent dietary changes, in which we are making each and every office visit, to improve these health parameters.  We reviewed her new meal plan in detail and questions were answered.  Patient's food recall appears to be accurate and consistent with what is on plan when she is following it.   When eating on plan, her hunger and cravings are well controlled.    Sejla says she misses her chips and craves carbs.  (Review of her eating habits:  Eats out 5-6 times per week, fast food.  Craves sugar, especially at night.  Snacks on ice cream, chips.  Skips breakfast or lunch.  "Pills helped me lose 50 pounds but I put it back on once I stopped them."  Has never been on a diet/restricted meal plan. )    Plan:  Extensive discussion with her regarding ways to food prep.  Recipes given and ideas for "creative" dinners discussed.  Current Meal Plan: the Category 3 Plan for 90% of the time.   Current Exercise Plan: CrossFit for 30 minutes 2 times per  week.   Assessment/Plan:   Medications Discontinued During This Encounter  Medication Reason  . Cholecalciferol (VITAMIN D3) 1.25 MG (50000 UT) CAPS   . oxybutynin (DITROPAN-XL) 10 MG 24 hr tablet   . Semaglutide,0.25 or 0.5MG /DOS, (OZEMPIC, 0.25 OR 0.5 MG/DOSE,) 2 MG/1.5ML SOPN    Meds ordered this encounter  Medications  . metFORMIN (GLUCOPHAGE) 500 MG tablet    Sig: 1 po with lunch daily    Dispense:  30 tablet    Refill:  0  . Vitamin D, Ergocalciferol, (DRISDOL) 1.25 MG (50000 UNIT) CAPS capsule    Sig: Take 1 capsule (50,000 Units total) by mouth every 7 (seven) days.    Dispense:  4 capsule    Refill:  0   1. Prediabetes Not at goal. Goal is HgbA1c < 5.7.  Medication: None.  She could not afford Ozempic and is not taking it.  Plan:  Discussed labs with patient today.  She will continue to focus on protein-rich, low simple carbohydrate foods. We reviewed the importance of hydration, regular exercise for stress reduction, and restorative sleep.  She will start metformin 500 mg daily, as per below.  Lab Results  Component Value Date   HGBA1C 5.8 (H) 12/07/2020   Lab Results  Component Value Date   INSULIN 17.3 12/07/2020   - Start metFORMIN (GLUCOPHAGE) 500 MG tablet; 1 po with lunch daily  Dispense: 30 tablet; Refill: 0   2. Other hyperlipidemia Course: Not  at goal. Lipid-lowering medications: None.  PCP told her to start statin, but she declined to do so.  Has them at ome.  Counseled her regarding lab results.  Plan:  Discussed labs with patient today.  + Fam Hx CAD.  Jalaya reports that she was advised to start statin by PCP in past, but she had refused.   I recommend pt contact PCP based on latest labs if she agrees to take them now.  Start medicine as advised by PCP.  Otherwise, with [place], prudent nutritional plan, weight loss, increase exercise eventually.    Dietary changes: Increase soluble fiber, decrease simple carbohydrates, decrease saturated fat. Exercise  changes: Moderate to vigorous-intensity aerobic activity 150 minutes per week or as tolerated. We will continue to monitor along with PCP/specialists as it pertains to her weight loss journey.   Lab Results  Component Value Date   CHOL 253 (H) 12/07/2020   HDL 49 12/07/2020   LDLCALC 178 (H) 12/07/2020   LDLDIRECT 165.0 05/12/2019   TRIG 145 12/07/2020   CHOLHDL 5.2 (H) 12/07/2020   Lab Results  Component Value Date   ALT 26 09/12/2020   AST 20 09/12/2020   ALKPHOS 86 09/12/2020   BILITOT 0.3 09/12/2020   The 10-year ASCVD risk score [name], et al., 2013) is: 5%   Values used to calculate the score:     Age: 27 years     Sex: Female     Is Non-Hispanic African American: No     Diabetic: No     Tobacco smoker: No     Systolic Blood Pressure: 409 mmHg     Is BP treated: No     HDL Cholesterol: 49 mg/dL     Total Cholesterol: 253 mg/dL   3. Vitamin D deficiency Not at goal. Current vitamin D is 28.3, tested on 12/07/2020. Optimal goal > 50 ng/dL.  She endorses tiredness/fatigue.  She is not currently on a supplement.  She has not been on prescription vitamin D for "a long time now".  Plan: - Discussed importance of vitamin D to their health and well-being.  - possible symptoms of low Vitamin D can be low energy, depressed mood, muscle aches, joint aches, osteoporosis etc. - low Vitamin D levels may be linked to an increased risk of cardiovascular events and even increased risk of cancers- such as colon and breast.  - I recommend pt take a weekly prescription vit D - see script below   - Informed patient this may be a lifelong thing, and she was encouraged to continue to take the medicine until told otherwise.   - we will need to monitor levels regularly (every 3-4 mo on average) to keep levels within normal limits.  - weight loss will likely improve availability of vitamin D, thus encouraged Morena to continue with meal plan and their weight loss efforts to further improve  this condition - pt's questions and concerns regarding this condition addressed.  - Start Vitamin D, Ergocalciferol, (DRISDOL) 1.25 MG (50000 UNIT) CAPS capsule; Take 1 capsule (50,000 Units total) by mouth every 7 (seven) days.  Dispense: 4 capsule; Refill: 0   4. Obesity, current BMI 42.8  Course: Brynlee is currently in the action stage of change. As such, her goal is to continue with weight loss efforts.   Nutrition goals: She has agreed to the Category 3 Plan.   Exercise goals: For substantial health benefits, adults should do at least 150 minutes (2 hours and 30 minutes)  a week of moderate-intensity, or 75 minutes (1 hour and 15 minutes) a week of vigorous-intensity aerobic physical activity, or an equivalent combination of moderate- and vigorous-intensity aerobic activity. Aerobic activity should be performed in episodes of at least 10 minutes, and preferably, it should be spread throughout the week.  Behavioral modification strategies: increasing lean protein intake, decreasing simple carbohydrates, meal planning and cooking strategies, better snacking choices and planning for success.  Aryssa has agreed to follow-up with our clinic in 2 weeks. She was informed of the importance of frequent follow-up visits to maximize her success with intensive lifestyle modifications for her multiple health conditions.   Objective:   Blood pressure 133/74, pulse 65, temperature (!) 97.5 F (36.4 C), height 5\' 3"  (1.6 m), weight 241 lb (109.3 kg), SpO2 99 %. Body mass index is 42.69 kg/m.  General: Cooperative, alert, well developed, in no acute distress. HEENT: Conjunctivae and lids unremarkable. Cardiovascular: Regular rhythm.  Lungs: Normal Clarke of breathing. Neurologic: No focal deficits.   Lab Results  Component Value Date   CREATININE 0.74 09/12/2020   BUN 17 09/12/2020   NA 139 09/12/2020   K 4.0 09/12/2020   CL 106 09/12/2020   CO2 25 09/12/2020   Lab Results  Component Value  Date   ALT 26 09/12/2020   AST 20 09/12/2020   ALKPHOS 86 09/12/2020   BILITOT 0.3 09/12/2020   Lab Results  Component Value Date   HGBA1C 5.8 (H) 12/07/2020   HGBA1C 5.8 02/28/2020   HGBA1C 6.0 05/12/2019   HGBA1C 5.4 06/03/2018   HGBA1C 5.9 03/04/2018   Lab Results  Component Value Date   INSULIN 17.3 12/07/2020   Lab Results  Component Value Date   TSH 2.31 09/12/2020   Lab Results  Component Value Date   CHOL 253 (H) 12/07/2020   HDL 49 12/07/2020   LDLCALC 178 (H) 12/07/2020   LDLDIRECT 165.0 05/12/2019   TRIG 145 12/07/2020   CHOLHDL 5.2 (H) 12/07/2020   Lab Results  Component Value Date   WBC 6.7 09/12/2020   HGB 14.0 09/12/2020   HCT 41.3 09/12/2020   MCV 89.1 09/12/2020   PLT 195.0 09/12/2020   Lab Results  Component Value Date   IRON 67 03/04/2018   TIBC 360 03/04/2018   FERRITIN 109 03/04/2018   Attestation Statements:   Reviewed by clinician on day of visit: allergies, medications, problem list, medical Clarke, surgical Clarke, family Clarke, social Clarke, and previous encounter notes.  Time spent on visit including pre-visit chart review and post-visit care and charting was >40 minutes.   I, Water quality scientist, CMA, am acting as Location manager for [employer], DO.  I have reviewed the above documentation for accuracy and completeness, and I agree with the above. Marjory Sneddon, D.O.  The [place] was signed into law in 2016 which includes the topic of electronic health records.  This provides immediate access to information in MyChart.  This includes consultation notes, operative notes, office notes, lab results and pathology reports.  If you have any questions about what you read please let us know at your next visit so we can discuss your concerns and take corrective action if need be.  We are right here with you.

## 2020-12-29 ENCOUNTER — Other Ambulatory Visit: Payer: Self-pay | Admitting: *Deleted

## 2020-12-29 ENCOUNTER — Encounter: Payer: Self-pay | Admitting: Family Medicine

## 2020-12-29 MED ORDER — ATORVASTATIN CALCIUM 40 MG PO TABS: 40.0000 mg | ORAL_TABLET | Freq: Every day | ORAL | 3 refills | Status: DC

## 2020-12-29 NOTE — Telephone Encounter (Signed)
Patient notified Rx send to [facility]

## 2021-01-04 ENCOUNTER — Ambulatory Visit (INDEPENDENT_AMBULATORY_CARE_PROVIDER_SITE_OTHER): Payer: BC Managed Care – PPO | Admitting: Family Medicine

## 2021-01-04 ENCOUNTER — Encounter (INDEPENDENT_AMBULATORY_CARE_PROVIDER_SITE_OTHER): Payer: Self-pay | Admitting: Family Medicine

## 2021-01-04 ENCOUNTER — Other Ambulatory Visit: Payer: Self-pay

## 2021-01-04 VITALS — BP 127/78 | HR 64 | Temp 98.4°F | Ht 63.0 in | Wt 242.0 lb

## 2021-01-04 DIAGNOSIS — E7849 Other hyperlipidemia: Secondary | ICD-10-CM

## 2021-01-04 DIAGNOSIS — R7303 Prediabetes: Secondary | ICD-10-CM | POA: Diagnosis not present

## 2021-01-04 DIAGNOSIS — G4733 Obstructive sleep apnea (adult) (pediatric): Secondary | ICD-10-CM

## 2021-01-04 DIAGNOSIS — Z6841 Body Mass Index (BMI) 40.0 and over, adult: Secondary | ICD-10-CM

## 2021-01-04 DIAGNOSIS — Z9989 Dependence on other enabling machines and devices: Secondary | ICD-10-CM

## 2021-01-04 DIAGNOSIS — Z9189 Other specified personal risk factors, not elsewhere classified: Secondary | ICD-10-CM

## 2021-01-04 DIAGNOSIS — E559 Vitamin D deficiency, unspecified: Secondary | ICD-10-CM | POA: Diagnosis not present

## 2021-01-04 MED ORDER — VITAMIN D (ERGOCALCIFEROL) 1.25 MG (50000 UNIT) PO CAPS: 50000.0000 [IU] | ORAL_CAPSULE | ORAL | 0 refills | Status: DC

## 2021-01-04 MED ORDER — METFORMIN HCL 500 MG PO TABS: ORAL_TABLET | ORAL | 0 refills | Status: DC

## 2021-01-16 NOTE — Progress Notes (Signed)
Chief Complaint:   OBESITY Brooke Clarke is here to discuss her progress with her obesity treatment plan along with follow-up of her obesity related diagnoses.   Today's visit was #: 3 Starting weight: 244 lbs Starting date: 12/07/2020 Today's weight: 242 lbs Today's date: 01/04/2021 Weight change since last visit: +1 lb Total lbs lost to date: 2 lbs Body mass index is 42.87 kg/m.  Total weight loss percentage to date: -0.82%  Interim History:  Brooke Clarke is not measuring proteins at all.  Denies cravings or hunger.  She thinks less food equals greater weight loss.  She is going to [place] for 1 weeks next week to visit family.  Plan:  Weigh all proteins and measure food.  Current Meal Plan: the Category 3 Plan for 95% of the time.  Current Exercise Plan: CrossFit for 30 minutes 2 times per week.  Assessment/Plan:   Medications Discontinued During This Encounter  Medication Reason   metFORMIN (GLUCOPHAGE) 500 MG tablet Reorder   Vitamin D, Ergocalciferol, (DRISDOL) 1.25 MG (50000 UNIT) CAPS capsule Reorder    Meds ordered this encounter  Medications   metFORMIN (GLUCOPHAGE) 500 MG tablet    Sig: 1 po with lunch daily    Dispense:  30 tablet    Refill:  0   Vitamin D, Ergocalciferol, (DRISDOL) 1.25 MG (50000 UNIT) CAPS capsule    Sig: Take 1 capsule (50,000 Units total) by mouth every 7 (seven) days.    Dispense:  4 capsule    Refill:  0    1. Prediabetes Not optimized. Goal is HgbA1c < 5.7.  Medication: metformin 500 mg daily.  Started metformin last office visit.  She has less sweets cravings now.  Tolerating well.  Working well.    Plan:  Refill metformin today.  She will continue to focus on protein-rich, low simple carbohydrate foods. We reviewed the importance of hydration, regular exercise for stress reduction, and restorative sleep.   Lab Results  Component Value Date   HGBA1C 5.8 (H) 12/07/2020   Lab Results  Component Value Date   INSULIN 17.3 12/07/2020   -  Refill metFORMIN (GLUCOPHAGE) 500 MG tablet; 1 po with lunch daily  Dispense: 30 tablet; Refill: 0  2. Other hyperlipidemia Course: Not at goal. Lipid-lowering medications: Lipitor 40 mg daily.  She called her PCP and started on Lipitor recently.  Tolerating well thus far.  Plan: Dietary changes: Increase soluble fiber, decrease simple carbohydrates, decrease saturated fat. Exercise changes: Moderate to vigorous-intensity aerobic activity 150 minutes per week or as tolerated. We will continue to monitor along with PCP/specialists as it pertains to her weight loss journey.  Lab Results  Component Value Date   CHOL 253 (H) 12/07/2020   HDL 49 12/07/2020   LDLCALC 178 (H) 12/07/2020   LDLDIRECT 165.0 05/12/2019   TRIG 145 12/07/2020   CHOLHDL 5.2 (H) 12/07/2020   Lab Results  Component Value Date   ALT 26 09/12/2020   AST 20 09/12/2020   ALKPHOS 86 09/12/2020   BILITOT 0.3 09/12/2020   The 10-year ASCVD risk score [name], et al., 2013) is: 4.6%   Values used to calculate the score:     Age: 60 years     Sex: Female     Is Non-Hispanic African American: No     Diabetic: No     Tobacco smoker: No     Systolic Blood Pressure: 132 mmHg     Is BP treated: No  HDL Cholesterol: 49 mg/dL     Total Cholesterol: 253 mg/dL  3. Vitamin D deficiency Not at goal. Current vitamin D is 28.3, tested on 12/07/2020. Optimal goal > 50 ng/dL.  She is taking vitamin D 50,000 IU weekly.  Plan: Continue to take prescription Vitamin D @50 ,000 IU every week as prescribed.  Follow-up for routine testing of Vitamin D, at least 2-3 times per year to avoid over-replacement.  - Refill Vitamin D, Ergocalciferol, (DRISDOL) 1.25 MG (50000 UNIT) CAPS capsule; Take 1 capsule (50,000 Units total) by mouth every 7 (seven) days.  Dispense: 4 capsule; Refill: 0  4. OSA on CPAP She has an upcoming appointment with Sleep Medicine/Pulmonology for recheck of CPAP settings, etc.   OSA is a cause of systemic  hypertension and is associated with an increased incidence of stroke, heart failure, atrial fibrillation, and coronary heart disease. Severe OSA increases all-cause mortality and  cardiovascular mortality.   Goal: Treatment of OSA via CPAP compliance and weight loss. Plasma ghrelin levels (appetite or "hunger hormone") are significantly higher in OSA patients than in BMI-matched controls, but decrease to levels similar to those of obese patients without OSA after CPAP treatment.  Weight loss improves OSA by several mechanisms, including reduction in fatty tissue in the throat (i.e. parapharyngeal fat) and the tongue. Loss of abdominal fat increases mediastinal traction on the upper airway making it less likely to collapse during sleep. Studies have also shown that compliance with CPAP treatment improves leptin (hunger inhibitory hormone) imbalance.  5. At risk for impaired metabolic function Due to Brooke Clarke's current state of health and medical condition(s), she is at a significantly higher risk for impaired metabolic function.   At least 15 minutes was spent on counseling Brooke Clarke about these concerns today.  This places the patient at a much greater risk to subsequently develop cardio-pulmonary conditions that can negatively affect the patient's quality of life.  I stressed the importance of reversing these risks factors.  The initial goal is to lose at least 5-10% of starting weight to help reduce risk factors.  Counseling:  Intensive lifestyle modifications discussed with Brooke Clarke as the most appropriate first line treatment.  she will continue to work on diet, exercise, and weight loss efforts.  We will continue to reassess these conditions on a fairly regular basis in an attempt to decrease the patient's overall morbidity and mortality.  6. Obesity, current BMI 42.9  Course: Brooke Clarke is currently in the action stage of change. As such, her goal is to continue with weight loss efforts.   Nutrition goals: She  has agreed to the Category 3 Plan with breakfast options.   Exercise goals:  As is.  Behavioral modification strategies: increasing lean protein intake and travel eating strategies.  Brooke Clarke has agreed to follow-up with our clinic in 3-4 weeks. She was informed of the importance of frequent follow-up visits to maximize her success with intensive lifestyle modifications for her multiple health conditions.   Objective:   Blood pressure 127/78, pulse 64, temperature 98.4 F (36.9 C), height 5\' 3"  (1.6 m), weight 242 lb (109.8 kg), SpO2 97 %. Body mass index is 42.87 kg/m.  General: Cooperative, alert, well developed, in no acute distress. HEENT: Conjunctivae and lids unremarkable. Cardiovascular: Regular rhythm.  Lungs: Normal work of breathing. Neurologic: No focal deficits.   Lab Results  Component Value Date   CREATININE 0.74 09/12/2020   BUN 17 09/12/2020   NA 139 09/12/2020   K 4.0 09/12/2020   CL 106  09/12/2020   CO2 25 09/12/2020   Lab Results  Component Value Date   ALT 26 09/12/2020   AST 20 09/12/2020   ALKPHOS 86 09/12/2020   BILITOT 0.3 09/12/2020   Lab Results  Component Value Date   HGBA1C 5.8 (H) 12/07/2020   HGBA1C 5.8 02/28/2020   HGBA1C 6.0 05/12/2019   HGBA1C 5.4 06/03/2018   HGBA1C 5.9 03/04/2018   Lab Results  Component Value Date   INSULIN 17.3 12/07/2020   Lab Results  Component Value Date   TSH 2.31 09/12/2020   Lab Results  Component Value Date   CHOL 253 (H) 12/07/2020   HDL 49 12/07/2020   LDLCALC 178 (H) 12/07/2020   LDLDIRECT 165.0 05/12/2019   TRIG 145 12/07/2020   CHOLHDL 5.2 (H) 12/07/2020   Lab Results  Component Value Date   WBC 6.7 09/12/2020   HGB 14.0 09/12/2020   HCT 41.3 09/12/2020   MCV 89.1 09/12/2020   PLT 195.0 09/12/2020   Lab Results  Component Value Date   IRON 67 03/04/2018   TIBC 360 03/04/2018   FERRITIN 109 03/04/2018   Attestation Statements:   Reviewed by clinician on day of visit: allergies,  medications, problem list, medical history, surgical history, family history, social history, and previous encounter notes.  I, Water quality scientist, CMA, am acting as Location manager for [employer], DO.  I have reviewed the above documentation for accuracy and completeness, and I agree with the above. Marjory Sneddon, D.O.  The [place] was signed into law in 2016 which includes the topic of electronic health records.  This provides immediate access to information in MyChart.  This includes consultation notes, operative notes, office notes, lab results and pathology reports.  If you have any questions about what you read please let us know at your next visit so we can discuss your concerns and take corrective action if need be.  We are right here with you.

## 2021-01-31 ENCOUNTER — Ambulatory Visit (INDEPENDENT_AMBULATORY_CARE_PROVIDER_SITE_OTHER): Payer: BC Managed Care – PPO | Admitting: Family Medicine

## 2021-02-05 ENCOUNTER — Institutional Professional Consult (permissible substitution): Payer: BC Managed Care – PPO | Admitting: Pulmonary Disease

## 2021-02-19 ENCOUNTER — Other Ambulatory Visit: Payer: Self-pay

## 2021-02-19 ENCOUNTER — Ambulatory Visit (INDEPENDENT_AMBULATORY_CARE_PROVIDER_SITE_OTHER): Payer: BC Managed Care – PPO | Admitting: Family Medicine

## 2021-02-19 VITALS — BP 118/70 | HR 56 | Temp 97.9°F | Ht 63.0 in | Wt 235.0 lb

## 2021-02-19 DIAGNOSIS — E7849 Other hyperlipidemia: Secondary | ICD-10-CM

## 2021-02-19 DIAGNOSIS — R7303 Prediabetes: Secondary | ICD-10-CM

## 2021-02-19 DIAGNOSIS — Z9189 Other specified personal risk factors, not elsewhere classified: Secondary | ICD-10-CM

## 2021-02-19 DIAGNOSIS — E559 Vitamin D deficiency, unspecified: Secondary | ICD-10-CM | POA: Diagnosis not present

## 2021-02-19 DIAGNOSIS — Z6841 Body Mass Index (BMI) 40.0 and over, adult: Secondary | ICD-10-CM

## 2021-02-19 MED ORDER — VITAMIN D (ERGOCALCIFEROL) 1.25 MG (50000 UNIT) PO CAPS: 50000.0000 [IU] | ORAL_CAPSULE | ORAL | 0 refills | Status: DC

## 2021-02-19 MED ORDER — METFORMIN HCL 500 MG PO TABS: ORAL_TABLET | ORAL | 0 refills | Status: DC

## 2021-02-26 ENCOUNTER — Telehealth (INDEPENDENT_AMBULATORY_CARE_PROVIDER_SITE_OTHER): Payer: Self-pay

## 2021-02-26 NOTE — Telephone Encounter (Signed)
Pt called in and stated that she wanted to check on the status of her PA for her Vitamin D and for her Metformin. Please advise

## 2021-02-26 NOTE — Telephone Encounter (Signed)
Last Ov with Dr Raliegh Scarlet

## 2021-02-27 NOTE — Telephone Encounter (Signed)
Do you mind checking into this?

## 2021-02-28 NOTE — Telephone Encounter (Signed)
See message from pt below, Thanks

## 2021-03-01 ENCOUNTER — Encounter (INDEPENDENT_AMBULATORY_CARE_PROVIDER_SITE_OTHER): Payer: Self-pay

## 2021-03-01 NOTE — Telephone Encounter (Signed)
Sent the patient a message.

## 2021-03-01 NOTE — Progress Notes (Signed)
Chief Complaint:   OBESITY Brooke Clarke is here to discuss her progress with her obesity treatment plan along with follow-up of her obesity related diagnoses.   Today's visit was #: 3 Starting weight: 244 lbs Starting date: 12/07/2020 Today's weight: 235 lbs Today's date: 02/19/2021 Weight change since last visit: 7 lbs Total lbs lost to date: 9 lbs Body mass index is 41.63 kg/m.  Total weight loss percentage to date: -3.69%  Interim History:  Brooke Clarke says that for breakfast, she has Special K Protein cereal with milk or eggs/toast.  For lunch, she has a sandwich with vegetables and mayo or dijon mustard.  Snacks consist of fruits, vegetable sticks, popcorn or a protein bar.  Dinner will be meat and a vegetable.  It is difficult to eat all the meat.  Last office visit was 01/04/2021.  Current Meal Plan: the Category 3 Plan with breakfast options for 85-90% of the time.  Current Exercise Plan: Crossfit, walking for 30-60 minutes 3 times per week.  Assessment/Plan:   Orders Placed This Encounter  Procedures   Comprehensive metabolic panel   Lipid Panel With LDL/HDL Ratio   Vitamin B12   Hemoglobin A1c   Insulin, random   VITAMIN D 25 Hydroxy (Vit-D Deficiency, Fractures)   Medications Discontinued During This Encounter  Medication Reason   metFORMIN (GLUCOPHAGE) 500 MG tablet Reorder   Vitamin D, Ergocalciferol, (DRISDOL) 1.25 MG (50000 UNIT) CAPS capsule Reorder    Meds ordered this encounter  Medications   metFORMIN (GLUCOPHAGE) 500 MG tablet    Sig: 1 po with lunch daily    Dispense:  30 tablet    Refill:  0   Vitamin D, Ergocalciferol, (DRISDOL) 1.25 MG (50000 UNIT) CAPS capsule    Sig: Take 1 capsule (50,000 Units total) by mouth every 7 (seven) days.    Dispense:  4 capsule    Refill:  0    1. Prediabetes Not at goal. Goal is HgbA1c < 5.7.  Medication: metformin 500 mg daily.  Working well to decrease sweets cravings.  Plan:  Refill metformin today.  She will  continue to focus on protein-rich, low simple carbohydrate foods. We reviewed the importance of hydration, regular exercise for stress reduction, and restorative sleep.  Will check labs at next office visit.  Lab Results  Component Value Date   HGBA1C 5.8 (H) 12/07/2020   Lab Results  Component Value Date   INSULIN 17.3 12/07/2020   - Refill metFORMIN (GLUCOPHAGE) 500 MG tablet; 1 po with lunch daily  Dispense: 30 tablet; Refill: 0 - Comprehensive metabolic panel - Vitamin M08 - Hemoglobin A1c - Insulin, random  2. Vitamin D deficiency Not at goal.  She is taking vitamin D 50,000 IU weekly.  Plan: Continue to take prescription Vitamin D @50 ,000 IU every week as prescribed.  Will check vitamin D level at next visit.  Lab Results  Component Value Date   VD25OH 28.3 (L) 12/07/2020   VD25OH 21.79 (L) 05/12/2019   VD25OH 26.55 (L) 03/04/2018   - Refill Vitamin D, Ergocalciferol, (DRISDOL) 1.25 MG (50000 UNIT) CAPS capsule; Take 1 capsule (50,000 Units total) by mouth every 7 (seven) days.  Dispense: 4 capsule; Refill: 0 - VITAMIN D 25 Hydroxy (Vit-D Deficiency, Fractures)  3. Other hyperlipidemia Course: Not at goal. Lipid-lowering medications: Lipitor 40 mg daily.  Started on Lipitor ~12/21/2020.  Tolerating well without side effects.  Compliance good.  Plan:  Continue medications per PCP.  Continue prudent nutritional plan and increase  exercise.  Dietary changes: Increase soluble fiber, decrease simple carbohydrates, decrease saturated fat. Exercise changes: Moderate to vigorous-intensity aerobic activity 150 minutes per week or as tolerated. We will continue to monitor along with PCP/specialists as it pertains to her weight loss journey.  Check labs at next visit.  Lab Results  Component Value Date   CHOL 253 (H) 12/07/2020   HDL 49 12/07/2020   LDLCALC 178 (H) 12/07/2020   LDLDIRECT 165.0 05/12/2019   TRIG 145 12/07/2020   CHOLHDL 5.2 (H) 12/07/2020   Lab Results  Component  Value Date   ALT 26 09/12/2020   AST 20 09/12/2020   ALKPHOS 86 09/12/2020   BILITOT 0.3 09/12/2020   The 10-year ASCVD risk score [name], et al., 2013) is: 4.3%   Values used to calculate the score:     Age: 65 years     Sex: Female     Is Non-Hispanic African American: No     Diabetic: No     Tobacco smoker: No     Systolic Blood Pressure: 381 mmHg     Is BP treated: No     HDL Cholesterol: 49 mg/dL     Total Cholesterol: 253 mg/dL  - Lipid Panel With LDL/HDL Ratio  4. At risk for dehydration Brooke Clarke is at higher than average risk of dehydration.  She is getting in 60 ounces of water per day.  She says she will increase to a goal of 90-100 ounces.  Brooke Clarke was given more than 9 minutes of proper hydration counseling today.  We discussed the signs and symptoms of dehydration, some of which may include muscle cramping, constipation or even orthostatic symptoms.  Counseling on the prevention of dehydration was also provided today.  Brooke Clarke is at risk for dehydration due to weight loss, lifestyle and behavorial habits and possibly due to taking certain medication(s).  She was encouraged to adequately hydrate and monitor fluid status to avoid dehydration as well as weight loss plateaus.  Unless pre-existing renal or cardiopulmonary conditions exist, in which patient was told to limit their fluid intake, I recommended roughly one half of their weight in pounds to be the approximate ounces of non-caloric, non-caffeinated beverages they should drink per day; including more if they are engaging in exercise.  5. Obesity BMI today 41  Course: Brooke Clarke is currently in the action stage of change. As such, her goal is to continue with weight loss efforts.   Nutrition goals: She has agreed to the Category 3 Plan.   Exercise goals:  60 minutes 3 days per week.  Behavioral modification strategies: increasing lean protein intake, decreasing simple carbohydrates, meal planning and cooking strategies, and  planning for success.  Brooke Clarke has agreed to follow-up with our clinic in 4 weeks, fasting. She was informed of the importance of frequent follow-up visits to maximize her success with intensive lifestyle modifications for her multiple health conditions.   Objective:   Blood pressure 118/70, pulse (!) 56, temperature 97.9 F (36.6 C), height 5\' 3"  (1.6 m), weight 235 lb (106.6 kg), SpO2 96 %. Body mass index is 41.63 kg/m.  General: Cooperative, alert, well developed, in no acute distress. HEENT: Conjunctivae and lids unremarkable. Cardiovascular: Regular rhythm.  Lungs: Normal work of breathing. Neurologic: No focal deficits.   Lab Results  Component Value Date   CREATININE 0.74 09/12/2020   BUN 17 09/12/2020   NA 139 09/12/2020   K 4.0 09/12/2020   CL 106 09/12/2020   CO2 25 09/12/2020  Lab Results  Component Value Date   ALT 26 09/12/2020   AST 20 09/12/2020   ALKPHOS 86 09/12/2020   BILITOT 0.3 09/12/2020   Lab Results  Component Value Date   HGBA1C 5.8 (H) 12/07/2020   HGBA1C 5.8 02/28/2020   HGBA1C 6.0 05/12/2019   HGBA1C 5.4 06/03/2018   HGBA1C 5.9 03/04/2018   Lab Results  Component Value Date   INSULIN 17.3 12/07/2020   Lab Results  Component Value Date   TSH 2.31 09/12/2020   Lab Results  Component Value Date   CHOL 253 (H) 12/07/2020   HDL 49 12/07/2020   LDLCALC 178 (H) 12/07/2020   LDLDIRECT 165.0 05/12/2019   TRIG 145 12/07/2020   CHOLHDL 5.2 (H) 12/07/2020   Lab Results  Component Value Date   VD25OH 28.3 (L) 12/07/2020   VD25OH 21.79 (L) 05/12/2019   VD25OH 26.55 (L) 03/04/2018   Lab Results  Component Value Date   WBC 6.7 09/12/2020   HGB 14.0 09/12/2020   HCT 41.3 09/12/2020   MCV 89.1 09/12/2020   PLT 195.0 09/12/2020   Lab Results  Component Value Date   IRON 67 03/04/2018   TIBC 360 03/04/2018   FERRITIN 109 03/04/2018   Attestation Statements:   Reviewed by clinician on day of visit: allergies, medications, problem  list, medical history, surgical history, family history, social history, and previous encounter notes.  I, Water quality scientist, CMA, am acting as Location manager for [employer], DO.  I have reviewed the above documentation for accuracy and completeness, and I agree with the above. Marjory Sneddon, D.O.  The [place] was signed into law in 2016 which includes the topic of electronic health records.  This provides immediate access to information in MyChart.  This includes consultation notes, operative notes, office notes, lab results and pathology reports.  If you have any questions about what you read please let us know at your next visit so we can discuss your concerns and take corrective action if need be.  We are right here with you.

## 2021-03-19 ENCOUNTER — Ambulatory Visit (INDEPENDENT_AMBULATORY_CARE_PROVIDER_SITE_OTHER): Payer: BC Managed Care – PPO | Admitting: Family Medicine

## 2021-04-01 ENCOUNTER — Other Ambulatory Visit (INDEPENDENT_AMBULATORY_CARE_PROVIDER_SITE_OTHER): Payer: Self-pay | Admitting: Family Medicine

## 2021-04-01 DIAGNOSIS — E559 Vitamin D deficiency, unspecified: Secondary | ICD-10-CM

## 2021-04-01 DIAGNOSIS — R7303 Prediabetes: Secondary | ICD-10-CM

## 2021-04-02 ENCOUNTER — Ambulatory Visit (INDEPENDENT_AMBULATORY_CARE_PROVIDER_SITE_OTHER): Payer: BC Managed Care – PPO | Admitting: Family Medicine

## 2021-04-02 ENCOUNTER — Encounter (INDEPENDENT_AMBULATORY_CARE_PROVIDER_SITE_OTHER): Payer: Self-pay | Admitting: Family Medicine

## 2021-04-02 ENCOUNTER — Other Ambulatory Visit: Payer: Self-pay

## 2021-04-02 VITALS — BP 144/70 | HR 54 | Temp 97.9°F | Ht 63.0 in | Wt 235.0 lb

## 2021-04-02 DIAGNOSIS — Z9189 Other specified personal risk factors, not elsewhere classified: Secondary | ICD-10-CM

## 2021-04-02 DIAGNOSIS — Z6841 Body Mass Index (BMI) 40.0 and over, adult: Secondary | ICD-10-CM

## 2021-04-02 DIAGNOSIS — E7849 Other hyperlipidemia: Secondary | ICD-10-CM | POA: Diagnosis not present

## 2021-04-02 DIAGNOSIS — E559 Vitamin D deficiency, unspecified: Secondary | ICD-10-CM

## 2021-04-02 DIAGNOSIS — R7303 Prediabetes: Secondary | ICD-10-CM | POA: Diagnosis not present

## 2021-04-02 MED ORDER — METFORMIN HCL 500 MG PO TABS: ORAL_TABLET | ORAL | 0 refills | Status: DC

## 2021-04-02 MED ORDER — VITAMIN D (ERGOCALCIFEROL) 1.25 MG (50000 UNIT) PO CAPS: 50000.0000 [IU] | ORAL_CAPSULE | ORAL | 0 refills | Status: DC

## 2021-04-02 NOTE — Telephone Encounter (Signed)
Dr.Opalski ?

## 2021-04-03 LAB — COMPREHENSIVE METABOLIC PANEL
ALT: 22 IU/L (ref 0–32)
AST: 17 IU/L (ref 0–40)
Albumin/Globulin Ratio: 1.8 (ref 1.2–2.2)
Albumin: 4.4 g/dL (ref 3.8–4.8)
Alkaline Phosphatase: 111 IU/L (ref 44–121)
BUN/Creatinine Ratio: 25 (ref 12–28)
BUN: 19 mg/dL (ref 8–27)
Bilirubin Total: 0.5 mg/dL (ref 0.0–1.2)
CO2: 23 mmol/L (ref 20–29)
Calcium: 9.7 mg/dL (ref 8.7–10.3)
Chloride: 103 mmol/L (ref 96–106)
Creatinine, Ser: 0.77 mg/dL (ref 0.57–1.00)
Globulin, Total: 2.5 g/dL (ref 1.5–4.5)
Glucose: 115 mg/dL — ABNORMAL HIGH (ref 65–99)
Potassium: 4.9 mmol/L (ref 3.5–5.2)
Sodium: 139 mmol/L (ref 134–144)
Total Protein: 6.9 g/dL (ref 6.0–8.5)
eGFR: 87 mL/min/{1.73_m2} (ref >59)

## 2021-04-03 LAB — LIPID PANEL WITH LDL/HDL RATIO
Cholesterol, Total: 154 mg/dL (ref 100–199)
HDL: 52 mg/dL (ref >39)
LDL Chol Calc (NIH): 87 mg/dL (ref 0–99)
LDL/HDL Ratio: 1.7 ratio (ref 0.0–3.2)
Triglycerides: 76 mg/dL (ref 0–149)
VLDL Cholesterol Cal: 15 mg/dL (ref 5–40)

## 2021-04-03 LAB — VITAMIN B12: Vitamin B-12: 445 pg/mL (ref 232–1245)

## 2021-04-03 LAB — HEMOGLOBIN A1C
Est. average glucose Bld gHb Est-mCnc: 120 mg/dL
Hgb A1c MFr Bld: 5.8 % — ABNORMAL HIGH (ref 4.8–5.6)

## 2021-04-03 LAB — VITAMIN D 25 HYDROXY (VIT D DEFICIENCY, FRACTURES): Vit D, 25-Hydroxy: 64.6 ng/mL (ref 30.0–100.0)

## 2021-04-03 LAB — INSULIN, RANDOM: INSULIN: 28.6 u[IU]/mL — ABNORMAL HIGH (ref 2.6–24.9)

## 2021-04-03 NOTE — Progress Notes (Signed)
Chief Complaint:   OBESITY Brooke Clarke is here to discuss her progress with her obesity treatment plan along with follow-up of her obesity related diagnoses. Brooke Clarke is on the Category 3 Plan and states she is following her eating plan approximately (unknown)% of the time. Brooke Clarke states she is doing crossfit for 60 minutes 3 times per week.  Today's visit was #: 4 Starting weight: 244 lbs Starting date: 12/07/2020 Today's weight: 235 lbs Today's date: 04/02/2021 Total lbs lost to date: 9 Total lbs lost since last in-office visit: 0  Interim History: Brooke Clarke notes increased hunger on days she does Crossfit 3 days per week. She is not weighing protein and she feels hungry a lot but not eating all of the food on the plan either.  Subjective:   1. Pre-diabetes Brooke Clarke has a diagnosis of prediabetes based on her elevated HgA1c and was informed this puts her at greater risk of developing diabetes. She continues to work on diet and exercise to decrease her risk of diabetes. She denies nausea or hypoglycemia.  2. Vitamin D deficiency Brooke Clarke is currently taking prescription vitamin D 50,000 IU each week. She denies nausea, vomiting or muscle weakness.  3. At high risk for malnutrition Brooke Clarke is at increased risk for malnutrition due to insufficient intake.   Assessment/Plan:  No orders of the defined types were placed in this encounter.   Medications Discontinued During This Encounter  Medication Reason   metFORMIN (GLUCOPHAGE) 500 MG tablet Reorder   Vitamin D, Ergocalciferol, (DRISDOL) 1.25 MG (50000 UNIT) CAPS capsule Reorder     Meds ordered this encounter  Medications   metFORMIN (GLUCOPHAGE) 500 MG tablet    Sig: 1 po with lunch daily    Dispense:  30 tablet    Refill:  0   Vitamin D, Ergocalciferol, (DRISDOL) 1.25 MG (50000 UNIT) CAPS capsule    Sig: Take 1 capsule (50,000 Units total) by mouth every 7 (seven) days.    Dispense:  4 capsule    Refill:  0     1. Pre-diabetes [name]  will continue to work on weight loss, exercise, and decreasing simple carbohydrates to help decrease the risk of diabetes. We will refill metformin for 1 month.  - metFORMIN (GLUCOPHAGE) 500 MG tablet; 1 po with lunch daily  Dispense: 30 tablet; Refill: 0  2. Vitamin D deficiency Low Vitamin D level contributes to fatigue and are associated with obesity, breast, and colon cancer. We will refill prescription Vitamin D for 1 month. [name] will follow-up for routine testing of Vitamin D, at least 2-3 times per year to avoid over-replacement.  - Vitamin D, Ergocalciferol, (DRISDOL) 1.25 MG (50000 UNIT) CAPS capsule; Take 1 capsule (50,000 Units total) by mouth every 7 (seven) days.  Dispense: 4 capsule; Refill: 0  3. At high risk for malnutrition Brooke Clarke was given extensive malnutrition prevention education and counseling today of more than 9 minutes.  Counseled her that malnutrition refers to inappropriate nutrients or not the right balance of nutrients for optimal health.  Discussed with Brooke Clarke that it is absolutely possible to be malnourished but yet obese.  Risk factors, including but not limited to, inappropriate dietary choices, difficulty with obtaining food due to physical or financial limitations, and various physical and mental health conditions were reviewed with Brooke Clarke.   4. Obesity with current BMI of 41.7 Brooke Clarke is currently in the action stage of change. As such, her goal is to continue with weight loss efforts. She  has agreed to the Category 3 Plan with breakfast options. Protein equivalents sheet was given today.  We will go over her labs at her next office visit that were obtained today.  Exercise goals: As is.  Behavioral modification strategies: increasing lean protein intake  Brooke Clarke has agreed to follow-up with our clinic in 3 weeks. She was informed of the importance of frequent follow-up visits to maximize her success with intensive lifestyle modifications for her  multiple health conditions.   Brooke Clarke was informed we would discuss her lab results at her next visit unless there is a critical issue that needs to be addressed sooner. Brooke Clarke agreed to keep her next visit at the agreed upon time to discuss these results.  Objective:   Blood pressure (!) 144/70, pulse (!) 54, temperature 97.9 F (36.6 C), height '5\' 3"'$  (1.6 m), weight 235 lb (106.6 kg), SpO2 98 %. Body mass index is 41.63 kg/m.  General: Cooperative, alert, well developed, in no acute distress. HEENT: Conjunctivae and lids unremarkable. Cardiovascular: Regular rhythm.  Lungs: Normal work of breathing. Neurologic: No focal deficits.   Lab Results  Component Value Date   CREATININE 0.77 04/02/2021   BUN 19 04/02/2021   NA 139 04/02/2021   K 4.9 04/02/2021   CL 103 04/02/2021   CO2 23 04/02/2021   Lab Results  Component Value Date   ALT 22 04/02/2021   AST 17 04/02/2021   ALKPHOS 111 04/02/2021   BILITOT 0.5 04/02/2021   Lab Results  Component Value Date   HGBA1C 5.8 (H) 04/02/2021   HGBA1C 5.8 (H) 12/07/2020   HGBA1C 5.8 02/28/2020   HGBA1C 6.0 05/12/2019   HGBA1C 5.4 06/03/2018   Lab Results  Component Value Date   INSULIN 28.6 (H) 04/02/2021   INSULIN 17.3 12/07/2020   Lab Results  Component Value Date   TSH 2.31 09/12/2020   Lab Results  Component Value Date   CHOL 154 04/02/2021   HDL 52 04/02/2021   LDLCALC 87 04/02/2021   LDLDIRECT 165.0 05/12/2019   TRIG 76 04/02/2021   CHOLHDL 5.2 (H) 12/07/2020   Lab Results  Component Value Date   VD25OH 64.6 04/02/2021   VD25OH 28.3 (L) 12/07/2020   VD25OH 21.79 (L) 05/12/2019   Lab Results  Component Value Date   WBC 6.7 09/12/2020   HGB 14.0 09/12/2020   HCT 41.3 09/12/2020   MCV 89.1 09/12/2020   PLT 195.0 09/12/2020   Lab Results  Component Value Date   IRON 67 03/04/2018   TIBC 360 03/04/2018   FERRITIN 109 03/04/2018   Attestation Statements:   Reviewed by clinician on day of visit:  allergies, medications, problem list, medical history, surgical history, family history, social history, and previous encounter notes.   Wilhemena Durie, am acting as transcriptionist for [employer], DO.  I have reviewed the above documentation for accuracy and completeness, and I agree with the above. Marjory Sneddon, D.O.  The [place] was signed into law in 2016 which includes the topic of electronic health records.  This provides immediate access to information in MyChart.  This includes consultation notes, operative notes, office notes, lab results and pathology reports.  If you have any questions about what you read please let us know at your next visit so we can discuss your concerns and take corrective action if need be.  We are right here with you.

## 2021-04-08 ENCOUNTER — Encounter: Payer: Self-pay | Admitting: Family Medicine

## 2021-04-08 ENCOUNTER — Encounter (INDEPENDENT_AMBULATORY_CARE_PROVIDER_SITE_OTHER): Payer: Self-pay | Admitting: Family Medicine

## 2021-04-09 NOTE — Telephone Encounter (Signed)
Last Ov with Dr Raliegh Scarlet

## 2021-04-09 NOTE — Telephone Encounter (Signed)
Last OV with Dr Opalski 

## 2021-04-12 NOTE — Telephone Encounter (Signed)
Has this been done from 4 days ago?

## 2021-04-23 ENCOUNTER — Ambulatory Visit (INDEPENDENT_AMBULATORY_CARE_PROVIDER_SITE_OTHER): Payer: BC Managed Care – PPO | Admitting: Family Medicine

## 2021-05-07 ENCOUNTER — Ambulatory Visit (INDEPENDENT_AMBULATORY_CARE_PROVIDER_SITE_OTHER): Payer: BC Managed Care – PPO | Admitting: Bariatrics

## 2021-05-14 ENCOUNTER — Other Ambulatory Visit (INDEPENDENT_AMBULATORY_CARE_PROVIDER_SITE_OTHER): Payer: Self-pay | Admitting: Family Medicine

## 2021-05-14 DIAGNOSIS — R7303 Prediabetes: Secondary | ICD-10-CM

## 2021-05-14 DIAGNOSIS — E559 Vitamin D deficiency, unspecified: Secondary | ICD-10-CM

## 2021-05-23 ENCOUNTER — Ambulatory Visit (INDEPENDENT_AMBULATORY_CARE_PROVIDER_SITE_OTHER): Payer: BC Managed Care – PPO | Admitting: Bariatrics

## 2021-06-20 ENCOUNTER — Other Ambulatory Visit (HOSPITAL_COMMUNITY): Payer: Self-pay

## 2021-06-20 ENCOUNTER — Encounter (INDEPENDENT_AMBULATORY_CARE_PROVIDER_SITE_OTHER): Payer: Self-pay | Admitting: Family Medicine

## 2021-06-20 ENCOUNTER — Ambulatory Visit (INDEPENDENT_AMBULATORY_CARE_PROVIDER_SITE_OTHER): Payer: BC Managed Care – PPO | Admitting: Family Medicine

## 2021-06-20 ENCOUNTER — Other Ambulatory Visit: Payer: Self-pay

## 2021-06-20 VITALS — BP 144/82 | HR 65 | Temp 97.9°F | Ht 63.0 in | Wt 242.0 lb

## 2021-06-20 DIAGNOSIS — R7301 Impaired fasting glucose: Secondary | ICD-10-CM

## 2021-06-20 DIAGNOSIS — F3289 Other specified depressive episodes: Secondary | ICD-10-CM

## 2021-06-20 DIAGNOSIS — R030 Elevated blood-pressure reading, without diagnosis of hypertension: Secondary | ICD-10-CM | POA: Diagnosis not present

## 2021-06-20 DIAGNOSIS — Z6841 Body Mass Index (BMI) 40.0 and over, adult: Secondary | ICD-10-CM

## 2021-06-20 MED ORDER — TIRZEPATIDE 2.5 MG/0.5ML ~~LOC~~ SOAJ
2.5000 mg | SUBCUTANEOUS | 0 refills | Status: DC
  Filled 2021-06-20: qty 2, 28d supply, fill #0

## 2021-06-21 NOTE — Progress Notes (Signed)
Chief Complaint:   OBESITY Brooke Clarke is here to discuss her progress with her obesity treatment plan along with follow-up of her obesity related diagnoses. See Medical Weight Management Flowsheet for complete bioelectrical impedance results.  Today's visit was #: 5 Starting weight: 244 lbs Starting date: 12/07/2020 Weight change since last visit: +7 lbs Total lbs lost to date: 2 lbs Total weight loss percentage to date: -0.82%  Nutrition Plan: Category 3 Plan with breakfast options for 0% of the time. Activity: None.  Interim History: Brooke Clarke says she has been under increased stress and has been unable to focus on meal prep.  She reports that she her sleep has been poor.  She also says she has had increased polyphagia and emotional eating. Note: This is Brooke Clarke's first visit with me at [facility], though I was her PCP two years ago. We spent time reviewing her medical and social history since our last visit.  Assessment/Plan:   1. Impaired fasting glucose, with polyphagia This issue directly impacts care plan for optimization of BMI and metabolic health as it impacts the patient's ability to make lifestyle changes. Stop metformin and start Mounjaro 2.5 mg subcutaneously weekly, as per below.  - Start tirzepatide [facility]) 2.5 MG/0.5ML Pen; Inject 2.5 mg into the skin once a week.  Dispense: 2 mL; Refill: 0  2. Elevated blood pressure reading Brooke Clarke's blood pressure is elevated today.  She has a history of hypertension in the past.   Plan: Avoid buying foods that are: processed, frozen, or prepackaged to avoid excess salt. Ambulatory blood pressure monitoring was encouraged with a goal of at least 2-3 times weekly or when feeling poorly.  She was instructed to keep a log for [place] to review at each office visit.   We will continue to monitor closely alongside her PCP.    BP Readings from Last 3 Encounters:  06/20/21 (!) 144/82  04/02/21 (!) 144/70  02/19/21 118/70   Lab Results  Component  Value Date   CREATININE 0.77 04/02/2021   3. Other depression, with emotional eating Not at goal. Medication: Effexor-XR 75 mg daily.  Brooke Clarke endorses worsening emotional eating recently.  Plan:  Continue Effexor-XR. Discussed cues and consequences, how thoughts affect eating, model of thoughts, feelings, and behaviors, and strategies for change by focusing on the cue. Discussed cognitive distortions, coping thoughts, and how to change your thoughts.  4. Obesity with current BMI of 42.9  Course: Brooke Clarke is currently in the action stage of change. As such, her goal is to continue with weight loss efforts.   Nutrition goals: She has agreed to the Category 3 Plan with breakfast options.   Exercise goals: For substantial health benefits, adults should do at least 150 minutes (2 hours and 30 minutes) a week of moderate-intensity, or 75 minutes (1 hour and 15 minutes) a week of vigorous-intensity aerobic physical activity, or an equivalent combination of moderate- and vigorous-intensity aerobic activity. Aerobic activity should be performed in episodes of at least 10 minutes, and preferably, it should be spread throughout the week.  Behavioral modification strategies: increasing lean protein intake, decreasing simple carbohydrates, increasing vegetables, and increasing water intake.  Brooke Clarke has agreed to follow-up with our clinic in 2-3 weeks. She was informed of the importance of frequent follow-up visits to maximize her success with intensive lifestyle modifications for her multiple health conditions.   Objective:   Blood pressure (!) 144/82, pulse 65, temperature 97.9 F (36.6 C), temperature source Oral, height 5\' 3"  (1.6 m),  weight 242 lb (109.8 kg), SpO2 100 %. Body mass index is 42.87 kg/m.  General: Cooperative, alert, well developed, in no acute distress. HEENT: Conjunctivae and lids unremarkable. Cardiovascular: Regular rhythm.  Lungs: Normal work of breathing. Neurologic: No focal  deficits.   Lab Results  Component Value Date   CREATININE 0.77 04/02/2021   BUN 19 04/02/2021   NA 139 04/02/2021   K 4.9 04/02/2021   CL 103 04/02/2021   CO2 23 04/02/2021   Lab Results  Component Value Date   ALT 22 04/02/2021   AST 17 04/02/2021   ALKPHOS 111 04/02/2021   BILITOT 0.5 04/02/2021   Lab Results  Component Value Date   HGBA1C 5.8 (H) 04/02/2021   HGBA1C 5.8 (H) 12/07/2020   HGBA1C 5.8 02/28/2020   HGBA1C 6.0 05/12/2019   HGBA1C 5.4 06/03/2018   Lab Results  Component Value Date   INSULIN 28.6 (H) 04/02/2021   INSULIN 17.3 12/07/2020   Lab Results  Component Value Date   TSH 2.31 09/12/2020   Lab Results  Component Value Date   CHOL 154 04/02/2021   HDL 52 04/02/2021   LDLCALC 87 04/02/2021   LDLDIRECT 165.0 05/12/2019   TRIG 76 04/02/2021   CHOLHDL 5.2 (H) 12/07/2020   Lab Results  Component Value Date   VD25OH 64.6 04/02/2021   VD25OH 28.3 (L) 12/07/2020   VD25OH 21.79 (L) 05/12/2019   Lab Results  Component Value Date   WBC 6.7 09/12/2020   HGB 14.0 09/12/2020   HCT 41.3 09/12/2020   MCV 89.1 09/12/2020   PLT 195.0 09/12/2020   Lab Results  Component Value Date   IRON 67 03/04/2018   TIBC 360 03/04/2018   FERRITIN 109 03/04/2018   Attestation Statements:   Reviewed by clinician on day of visit: allergies, medications, problem list, medical history, surgical history, family history, social history, and previous encounter notes.  Time spent on visit including pre-visit chart review and post-visit care and documentation was 43 minutes. Time was spent on: preparing to see the patient (eg, review of tests), obtaining and/or reviewing separately obtained history, performing a medically necessary appropriate examination and/or evaluation, counseling and educating the patient/family/caregiver, and documenting clinical information in the electronic or other health.   I, Water quality scientist, CMA, am acting as transcriptionist for [name],  DO  I have reviewed the above documentation for accuracy and completeness, and I agree with the above. -  [name], DO, MS, FAAFP, DABOM - Family and Bariatric Medicine.

## 2021-06-25 ENCOUNTER — Telehealth: Payer: Self-pay

## 2021-06-25 NOTE — Telephone Encounter (Signed)
Patient Name: Brooke Clarke Gender: Female DOB: 11-16-1960 Age: 60 Y 43 M 6 D Return Phone Number: [phone] (Primary) Address: City/ State/ Zip: [place]  [address] Client [place] [facility] [place] [facility] [place] [facility] [place] [facility] Dimas Chyle- MD Contact Type Call Who Is Calling Patient / Member / Family / Caregiver Call Type Triage / Clinical Relationship To Patient Self Return Phone Number [phone] (Primary) Chief Complaint Numbness Reason for Call Symptomatic / Request for [name] states she has been having problems with her arms and hands falling asleep. Translation No Nurse Assessment Nurse: Jearld Pies, RN, Lovena Le Date/Time Eilene Ghazi Time): 06/25/2021 3:11:03 PM Confirm and document reason for call. If symptomatic, describe symptoms. ---Numbness in both hands and arms on and off for a while. Does the patient have any new or worsening symptoms? ---Yes Will a triage be completed? ---Yes Related visit to physician within the last 2 weeks? ---No Does the PT have any chronic conditions? (i.e. diabetes, asthma, this includes High risk factors for pregnancy, etc.) ---No Is this a behavioral health or substance abuse call? ---No Guidelines Guideline Title Affirmed Question Affirmed Notes Nurse Date/Time (Eastern Time) Neurologic Deficit [1] Numbness or tingling in one or both hands AND [2] is a chronic symptom (recurrent or ongoing AND present > 4 weeks) Jearld Pies, RN, Lovena Le 06/25/2021 3:11:31 PM Disp. Time Eilene Ghazi Time) Disposition Final User 06/25/2021 2:56:13 PM Attempt made - message left Jearld Pies, RN, Lovena Le PLEASE NOTE: All timestamps contained within this report are represented as [place] Standard Time. CONFIDENTIALTY NOTICE: This fax transmission is intended only for the addressee. It contains information that is legally privileged, confidential or otherwise  protected from use or disclosure. If you are not the intended recipient, you are strictly prohibited from reviewing, disclosing, copying using or disseminating any of this information or taking any action in reliance on or regarding this information. If you have received this fax in error, please notify us immediately by telephone so that we can arrange for its return to [place]. Phone: [phone], Toll-Free: [phone], Fax: [phone] Page: 2 of 2 Call Id: [id number] 06/25/2021 3:12:43 PM SEE PCP WITHIN 3 DAYS Yes Jearld Pies, RN, Apolonio Schneiders Disagree/Comply Comply Caller Understands Yes PreDisposition Call Doctor Care Advice Given Per Guideline SEE PCP WITHIN 3 DAYS: * You need to be seen within 2 or 3 days. * You become worse * Problems with bowel or bladder control * Constant numbness or weakness in arms or legs CALL BACK IF: Comments User: Malissa Hippo, RN Date/Time (Eastern Time): 06/25/2021 3:13:55 PM Transferred to main line to schedule an appt within dispo time frame. Referrals REFERRED TO PCP OFFICE

## 2021-06-27 ENCOUNTER — Encounter: Payer: Self-pay | Admitting: Physician Assistant

## 2021-06-27 ENCOUNTER — Ambulatory Visit (INDEPENDENT_AMBULATORY_CARE_PROVIDER_SITE_OTHER): Payer: BC Managed Care – PPO | Admitting: Physician Assistant

## 2021-06-27 ENCOUNTER — Other Ambulatory Visit: Payer: Self-pay

## 2021-06-27 VITALS — BP 139/83 | HR 57 | Temp 97.6°F | Ht 63.0 in | Wt 245.0 lb

## 2021-06-27 DIAGNOSIS — R202 Paresthesia of skin: Secondary | ICD-10-CM

## 2021-06-27 DIAGNOSIS — R7303 Prediabetes: Secondary | ICD-10-CM

## 2021-06-27 MED ORDER — PREGABALIN 25 MG PO CAPS: 25.0000 mg | ORAL_CAPSULE | Freq: Two times a day (BID) | ORAL | 0 refills | Status: DC

## 2021-06-27 NOTE — Patient Instructions (Signed)
Please send update in the next few weeks. Recheck as needed.

## 2021-06-27 NOTE — Progress Notes (Signed)
Subjective:    Patient ID: Brooke Clarke, female    DOB: [birth year], 60 y.o.   MRN: [id number]  Chief Complaint  Patient presents with   Numbness    HPI Patient is in today for pins and needles in both hands x 1 month. Constant feeling in her hands. Upper arms start hurting at night. No pain, weakness, or loss of grip strength. Works on the computer most of the day. No new medications.   Pt states she has always had neck issues, but nothing new to report. She does not think this is coming from her neck. She is worried that her prediabetes is affecting the nerves in her hands. She is working with [name] on weight loss.   Past Medical History:  Diagnosis Date   Asthma    Back pain    Bladder leak    Depression    Diabetes mellitus without complication (HCC)    Elevated blood sugar    Heartburn    High cholesterol    History of breast augmentation 09/03/2018   Pateint with Hx of 2 breast surgeries. Now, with pain in neck and breasts due to size. Would like reduction. Will refer to breast surgeon.    HSV-2 (herpes simplex virus 2) infection 07/19/2011   Positive culture 07/18/11    Prediabetes    Prediabetes    Sleep apnea    SOBOE (shortness of breath on exertion)    Swallowing difficulty    Vitamin D deficiency     Past Surgical History:  Procedure Laterality Date   CHOLECYSTECTOMY     FOOT SURGERY     plantar fibromatosis   KNEE SURGERY      Family History  Problem Relation Age of Onset   Diabetes Father    Hyperlipidemia Father    Hypertension Father    Heart disease Father    Sudden death Father    Sleep apnea Father    Obesity Father    Stroke Other    Melanoma Mother    Cancer Mother     Social History   Tobacco Use   Smoking status: Never   Smokeless tobacco: Never  Vaping Use   Vaping Use: Never used  Substance Use Topics   Alcohol use: Not Currently   Drug use: Never     Allergies  Allergen Reactions   Hydrocodone-Acetaminophen Itching     Review of Systems NEGATIVE UNLESS OTHERWISE INDICATED IN HPI      Objective:     BP 139/83   Pulse (!) 57   Temp 97.6 F (36.4 C)   Ht 5\' 3"  (1.6 m)   Wt 245 lb (111.1 kg)   SpO2 96%   BMI 43.40 kg/m   Wt Readings from Last 3 Encounters:  06/27/21 245 lb (111.1 kg)  06/20/21 242 lb (109.8 kg)  04/02/21 235 lb (106.6 kg)    BP Readings from Last 3 Encounters:  06/27/21 139/83  06/20/21 (!) 144/82  04/02/21 (!) 144/70     Physical Exam Vitals and nursing note reviewed.  Constitutional:      Appearance: She is obese.  Musculoskeletal:     Right hand: No tenderness. Normal range of motion. Normal strength. Decreased sensation.     Left hand: No tenderness. Normal range of motion. Normal strength. Decreased sensation.     Comments: Tinnel and phalen's signs negative   Neurological:     Mental Status: She is alert.       Assessment &  Plan:   Problem List Items Addressed This Visit       Other   Prediabetes   Other Visit Diagnoses     Paresthesia of both hands    -  Primary        Meds ordered this encounter  Medications   pregabalin (LYRICA) 25 MG capsule    Sig: Take 1 capsule (25 mg total) by mouth 2 (two) times daily.    Dispense:  60 capsule    Refill:  0   1. Paresthesia of both hands 2. Pre-diabetes Lab Results  Component Value Date   HGBA1C 5.8 (H) 04/02/2021   -Discussed several options with the patient including getting an EMG study and or imaging of the neck, but patient declines at this time.  -Ultimately decided that we could trial Lyrica 25 mg twice daily to see if this helps alleviate some of the pins-and-needles sensation. Possible SE discussed. -She is going to continue to work on weight loss and getting her sugar levels down. -She knows to follow-up if symptoms do not improve or she starts to have weakness in her arms and hands, pain develops, or other new symptoms arise.   This note was prepared with assistance of Actor. Occasional wrong-word or sound-a-like substitutions may have occurred due to the inherent limitations of voice recognition software.  Time Spent: 26 minutes of total time was spent on the date of the encounter performing the following actions: chart review prior to seeing the patient, obtaining history, performing a medically necessary exam, counseling on the treatment plan, placing orders, and documenting in our EHR.    Correll Denbow M Mariska Daffin, PA-C

## 2021-06-28 NOTE — Telephone Encounter (Signed)
See note

## 2021-07-02 ENCOUNTER — Encounter (INDEPENDENT_AMBULATORY_CARE_PROVIDER_SITE_OTHER): Payer: Self-pay

## 2021-07-02 ENCOUNTER — Telehealth (INDEPENDENT_AMBULATORY_CARE_PROVIDER_SITE_OTHER): Payer: Self-pay | Admitting: Family Medicine

## 2021-07-02 NOTE — Telephone Encounter (Signed)
Prior authorization denied for Mounjaro. Patient sent mychart message.  

## 2021-07-03 ENCOUNTER — Encounter (INDEPENDENT_AMBULATORY_CARE_PROVIDER_SITE_OTHER): Payer: Self-pay | Admitting: Family Medicine

## 2021-07-03 NOTE — Telephone Encounter (Signed)
LMTCB

## 2021-07-09 ENCOUNTER — Other Ambulatory Visit: Payer: Self-pay | Admitting: Family Medicine

## 2021-07-09 NOTE — Telephone Encounter (Signed)
  Encourage patient to contact the pharmacy for refills or they can request refills through [place]:  06/27/21  NEXT APPOINTMENT DATE:  MEDICATION: venlafaxine XR (EFFEXOR XR) 75 MG 24 hr capsule  Is the patient out of medication? Yes   PHARMACY: [id number] - [place], [place] - [address] AT [place]  Let patient know to contact pharmacy at the end of the day to make sure medication is ready.  Please notify patient to allow 48-72 hours to process

## 2021-07-09 NOTE — Progress Notes (Signed)
Brooke Clarke is a 60 y.o. female here for frequent urination.   History of Present Illness:   Chief Complaint  Patient presents with  . Urinary Tract Infection    Pt c/o symptoms of UTI frequent urination, burning, urgency  Cranberry and AZO not alleviating symptoms   Pt needs a pap smear and mammogram but doesn't have an OBGYN;      HPI  Dysuria Brooke Clarke presents with c/o frequent urination, burning and urgency that has been ongoing for a week.  Pt does have a hx of urinary incontinence and believes current urinary sx are making it worse to the point where she is having accidents at work. In regards to new onset sx, pt initially believed them to be caused by a UTI. In an effort to alleviate her sx, she used OTC AZO and cranberry juice, which provided her no relief. Currently she reports the burning with urination has improved slightly, but is still present. Denies nausea, vomiting, fever, chills, back pain, abnormal vaginal discharge, or hx of kidney stones.   Urinary Incontinence According to pt, she has had a problem with urinary incontinence for some time now and would like to have this further evaluated. She believes it to be getting worse despite not in taking a much fluid and wearing pads for leakage protection. At this time she is interested in a referral to urology.   Pap Smear Currently Brooke Clarke expresses she would like her annual pap smear completed today by me rather than her female PCP, Dr. Jerline Pain. She also stated she does not have an OBGYN and she didn't want to wait at this moment.   Past Medical History:  Diagnosis Date  . Asthma   . Back pain   . Bladder leak   . Depression   . Diabetes mellitus without complication ([place])   . Elevated blood sugar   . Heartburn   . High cholesterol   . History of breast augmentation 09/03/2018   Pateint with Hx of 2 breast surgeries. Now, with pain in neck and breasts due to size. Would like reduction. Will refer to breast surgeon.    Marland Kitchen HSV-2 (herpes simplex virus 2) infection 07/19/2011   Positive culture 07/18/11   . Prediabetes   . Prediabetes   . Sleep apnea   . SOBOE (shortness of breath on exertion)   . Swallowing difficulty   . Vitamin D deficiency      Social History   Tobacco Use  . Smoking status: Never  . Smokeless tobacco: Never  Vaping Use  . Vaping Use: Never used  Substance Use Topics  . Alcohol use: Not Currently  . Drug use: Never    Past Surgical History:  Procedure Laterality Date  . CHOLECYSTECTOMY    . FOOT SURGERY     plantar fibromatosis  . KNEE SURGERY      Family History  Problem Relation Age of Onset  . Diabetes Father   . Hyperlipidemia Father   . Hypertension Father   . Heart disease Father   . Sudden death Father   . Sleep apnea Father   . Obesity Father   . Stroke Other   . Melanoma Mother   . Cancer Mother     Allergies  Allergen Reactions  . Hydrocodone-Acetaminophen Itching    Current Medications:   Current Outpatient Medications:  .  atorvastatin (LIPITOR) 40 MG tablet, Take 1 tablet (40 mg total) by mouth daily., Disp: 90 tablet, Rfl: 3 .  CALCIUM  PO, Take by mouth., Disp: , Rfl:  .  pregabalin (LYRICA) 25 MG capsule, Take 1 capsule (25 mg total) by mouth 2 (two) times daily., Disp: 60 capsule, Rfl: 0 .  Probiotic Product (PROBIOTIC PO), Probiotic, Disp: , Rfl:  .  tirzepatide (MOUNJARO) 2.5 MG/0.5ML Pen, Inject 2.5 mg into the skin once a week., Disp: 2 mL, Rfl: 0 .  venlafaxine XR (EFFEXOR-XR) 75 MG 24 hr capsule, TAKE 1 CAPSULE(75 MG) BY MOUTH DAILY WITH BREAKFAST, Disp: 30 capsule, Rfl: 5 .  Vitamin D, Ergocalciferol, (DRISDOL) 1.25 MG (50000 UNIT) CAPS capsule, Take 1 capsule (50,000 Units total) by mouth every 7 (seven) days., Disp: 4 capsule, Rfl: 0   Review of Systems:   ROS Negative unless otherwise specified per HPI. Vitals:   Vitals:   07/10/21 0941  BP: 131/76  Pulse: (!) 56  Temp: (!) 97.4 F (36.3 C)  TempSrc: Temporal  SpO2:  95%  Weight: 242 lb 9.6 oz (110 kg)  Height: 5\' 3"  (1.6 m)     Body mass index is 42.97 kg/m.  Physical Exam:   Physical Exam Constitutional:      Appearance: Normal appearance. She is well-developed.  HENT:     Head: Normocephalic and atraumatic.  Eyes:     General: Lids are normal.     Extraocular Movements: Extraocular movements intact.     Conjunctiva/sclera: Conjunctivae normal.  Pulmonary:     Effort: Pulmonary effort is normal.  Abdominal:     General: Abdomen is flat. Bowel sounds are normal.     Palpations: Abdomen is soft.     Tenderness: There is no abdominal tenderness. There is no right CVA tenderness or left CVA tenderness.  Musculoskeletal:        General: Normal range of motion.     Cervical back: Normal range of motion and neck supple.  Skin:    General: Skin is warm and dry.  Neurological:     Mental Status: She is alert and oriented to person, place, and time.  Psychiatric:        Attention and Perception: Attention and perception normal.        Mood and Affect: Mood normal.        Behavior: Behavior normal.        Thought Content: Thought content normal.        Judgment: Judgment normal.   Results for orders placed or performed in visit on 07/10/21  POCT urinalysis dipstick  Result Value Ref Range   Color, UA yellow    Clarity, UA little cloudy    Glucose, UA Negative Negative   Bilirubin, UA neg    Ketones, UA neg    Spec Grav, UA 1.015 1.010 - 1.025   Blood, UA neg    pH, UA 5.5 5.0 - 8.0   Protein, UA Negative Negative   Urobilinogen, UA 0.2 0.2 or 1.0 E.U./dL   Nitrite, UA neg    Leukocytes, UA Negative Negative   Appearance     Odor       Assessment and Plan:   Dysuria No red flags UA is negative however symptoms are consistent with possible UTI Start empiric keflex 500 mg twice daily x 7 days Urine culture ordered -- will adjust culture based on results Encouraged patient to push more fluids I have provided patient with  recommendations as to prevent further UTIs  Urinary Incontinence, unspecified type Will refer to [facility] for further evaluation  Pap Smear for Cervical Cancer  Screening Completed today No red flags on exam  Screening examination for STD (sexually transmitted disease) STD screening performed today  I,Havlyn C Ratchford,acting as a scribe for Inda Coke, PA.,have documented all relevant documentation on the behalf of Inda Coke, PA,as directed by  Inda Coke, PA while in the presence of Inda Coke, [place].'  I, Inda Coke, [place], have reviewed all documentation for this visit. The documentation on 07/10/21 for the exam, diagnosis, procedures, and orders are all accurate and complete.   Inda Coke, PA-C

## 2021-07-10 ENCOUNTER — Other Ambulatory Visit: Payer: Self-pay

## 2021-07-10 ENCOUNTER — Ambulatory Visit (INDEPENDENT_AMBULATORY_CARE_PROVIDER_SITE_OTHER): Payer: BC Managed Care – PPO | Admitting: Physician Assistant

## 2021-07-10 ENCOUNTER — Encounter: Payer: Self-pay | Admitting: Physician Assistant

## 2021-07-10 ENCOUNTER — Other Ambulatory Visit (HOSPITAL_COMMUNITY)
Admission: RE | Admit: 2021-07-10 | Discharge: 2021-07-10 | Disposition: A | Discharge status: Home / Self Care | Payer: BC Managed Care – PPO | Source: Ambulatory Visit | Attending: Physician Assistant | Admitting: Physician Assistant

## 2021-07-10 VITALS — BP 131/76 | HR 56 | Temp 97.4°F | Ht 63.0 in | Wt 242.6 lb

## 2021-07-10 DIAGNOSIS — Z113 Encounter for screening for infections with a predominantly sexual mode of transmission: Secondary | ICD-10-CM | POA: Diagnosis not present

## 2021-07-10 DIAGNOSIS — R32 Unspecified urinary incontinence: Secondary | ICD-10-CM | POA: Diagnosis not present

## 2021-07-10 DIAGNOSIS — R300 Dysuria: Secondary | ICD-10-CM

## 2021-07-10 DIAGNOSIS — Z124 Encounter for screening for malignant neoplasm of cervix: Secondary | ICD-10-CM | POA: Diagnosis not present

## 2021-07-10 LAB — POCT URINALYSIS DIPSTICK
Bilirubin, UA: NEGATIVE
Blood, UA: NEGATIVE
Glucose, UA: NEGATIVE
Ketones, UA: NEGATIVE
Leukocytes, UA: NEGATIVE
Nitrite, UA: NEGATIVE
Protein, UA: NEGATIVE
Spec Grav, UA: 1.015 (ref 1.010–1.025)
Urobilinogen, UA: 0.2 E.U./dL
pH, UA: 5.5 (ref 5.0–8.0)

## 2021-07-10 MED ORDER — CEPHALEXIN 500 MG PO CAPS: 500.0000 mg | ORAL_CAPSULE | Freq: Two times a day (BID) | ORAL | 0 refills | Status: DC

## 2021-07-10 NOTE — Patient Instructions (Addendum)
It was great to see you!  Start keflex 500 mg twice daily for your UTI symptoms I will be in touch with your urine culture results  General instructions Make sure you: Pee until your bladder is empty. Do not hold pee for a long time. Empty your bladder after sex. Wipe from front to back after pooping if you are a female. Use each tissue one time when you wipe. Drink enough fluid to keep your pee pale yellow. Keep all follow-up visits as told by your doctor. This is important. Contact a doctor if: You do not get better after 1-2 days. Your symptoms go away and then come back. Get help right away if: You have very bad back pain. You have very bad pain in your lower belly. You have a fever. You are sick to your stomach (nauseous). You are throwing up.   We will place urology referral - [facility], they will call you for an appointment  I will also be in touch with your PAP smear results  Therapy recommendations: Zella Ball: [email or web address] [facility] [place]: [email or web address]  Take care,  Inda Coke PA-C

## 2021-07-11 LAB — CYTOLOGY - PAP
Comment: NEGATIVE
Diagnosis: NEGATIVE
High risk HPV: NEGATIVE

## 2021-07-11 LAB — CERVICOVAGINAL ANCILLARY ONLY
Bacterial Vaginitis (gardnerella): NEGATIVE
Candida Glabrata: NEGATIVE
Candida Vaginitis: NEGATIVE
Chlamydia: NEGATIVE
Comment: NEGATIVE
Comment: NEGATIVE
Comment: NEGATIVE
Comment: NEGATIVE
Comment: NEGATIVE
Comment: NORMAL
Neisseria Gonorrhea: NEGATIVE
Trichomonas: NEGATIVE

## 2021-07-12 ENCOUNTER — Other Ambulatory Visit: Payer: Self-pay | Admitting: Physician Assistant

## 2021-07-12 ENCOUNTER — Other Ambulatory Visit (HOSPITAL_COMMUNITY): Payer: Self-pay

## 2021-07-12 ENCOUNTER — Other Ambulatory Visit (INDEPENDENT_AMBULATORY_CARE_PROVIDER_SITE_OTHER): Payer: Self-pay | Admitting: Family Medicine

## 2021-07-12 DIAGNOSIS — R7301 Impaired fasting glucose: Secondary | ICD-10-CM

## 2021-07-12 LAB — URINE CULTURE
MICRO NUMBER:: 12689781
SPECIMEN QUALITY:: ADEQUATE

## 2021-07-12 NOTE — Telephone Encounter (Signed)
Pt last seen by Dr. Wallace.  

## 2021-07-16 ENCOUNTER — Other Ambulatory Visit (HOSPITAL_COMMUNITY): Payer: Self-pay

## 2021-07-16 ENCOUNTER — Encounter (INDEPENDENT_AMBULATORY_CARE_PROVIDER_SITE_OTHER): Payer: Self-pay | Admitting: Family Medicine

## 2021-07-16 ENCOUNTER — Other Ambulatory Visit (INDEPENDENT_AMBULATORY_CARE_PROVIDER_SITE_OTHER): Payer: Self-pay | Admitting: Family Medicine

## 2021-07-16 DIAGNOSIS — R7301 Impaired fasting glucose: Secondary | ICD-10-CM

## 2021-07-16 NOTE — Telephone Encounter (Signed)
Pt last seen by Dr. Wallace.  

## 2021-07-17 ENCOUNTER — Other Ambulatory Visit (HOSPITAL_COMMUNITY): Payer: Self-pay

## 2021-07-17 MED ORDER — ONDANSETRON 4 MG PO TBDP
4.0000 mg | ORAL_TABLET | Freq: Three times a day (TID) | ORAL | 0 refills | Status: DC | PRN
  Filled 2021-07-17: qty 20, 7d supply, fill #0

## 2021-07-17 MED ORDER — TIRZEPATIDE 5 MG/0.5ML ~~LOC~~ SOAJ
5.0000 mg | SUBCUTANEOUS | 0 refills | Status: DC
Start: 2021-07-17 — End: 2021-07-25
  Filled 2021-07-17: qty 2, 28d supply, fill #0

## 2021-07-17 NOTE — Telephone Encounter (Signed)
Dr.Wallace °

## 2021-07-25 ENCOUNTER — Ambulatory Visit (INDEPENDENT_AMBULATORY_CARE_PROVIDER_SITE_OTHER): Payer: BC Managed Care – PPO | Admitting: Family Medicine

## 2021-07-25 ENCOUNTER — Encounter (INDEPENDENT_AMBULATORY_CARE_PROVIDER_SITE_OTHER): Payer: Self-pay | Admitting: Family Medicine

## 2021-07-25 ENCOUNTER — Other Ambulatory Visit (HOSPITAL_COMMUNITY): Payer: Self-pay

## 2021-07-25 ENCOUNTER — Other Ambulatory Visit: Payer: Self-pay

## 2021-07-25 VITALS — BP 131/74 | HR 59 | Temp 97.8°F | Ht 63.0 in | Wt 240.0 lb

## 2021-07-25 DIAGNOSIS — G4733 Obstructive sleep apnea (adult) (pediatric): Secondary | ICD-10-CM | POA: Diagnosis not present

## 2021-07-25 DIAGNOSIS — Z9989 Dependence on other enabling machines and devices: Secondary | ICD-10-CM

## 2021-07-25 DIAGNOSIS — F3289 Other specified depressive episodes: Secondary | ICD-10-CM | POA: Diagnosis not present

## 2021-07-25 DIAGNOSIS — R7301 Impaired fasting glucose: Secondary | ICD-10-CM | POA: Diagnosis not present

## 2021-07-25 DIAGNOSIS — Z6841 Body Mass Index (BMI) 40.0 and over, adult: Secondary | ICD-10-CM

## 2021-07-25 MED ORDER — TIRZEPATIDE 5 MG/0.5ML ~~LOC~~ SOAJ
5.0000 mg | SUBCUTANEOUS | 2 refills | Status: DC
  Filled 2021-07-25 – 2021-08-16 (×2): qty 2, 28d supply, fill #0

## 2021-07-26 NOTE — Progress Notes (Signed)
Chief Complaint:   OBESITY Brooke Clarke is here to discuss her progress with her obesity treatment plan along with follow-up of her obesity related diagnoses. See Medical Weight Management Flowsheet for complete bioelectrical impedance results.  Today's visit was #: 6 Starting weight: 244 lbs Starting date: 12/07/2020 Weight change since last visit: 2 lbs Total lbs lost to date: 4 lbs Total weight loss percentage to date: -1.64%  Nutrition Plan: Category 3 Plan with breakfast options for 0% of the time.  Activity: None. Anti-obesity medications: Mounjaro 5 mg subcutaneously weekly. Reported side effects: None.  Interim History: Brooke Clarke says that since our last visit, she has started the divorce process.  She is very happy about this.  She would like therapy.  Assessment/Plan:   1. Impaired fasting glucose, with polphagia Controlled. Current treatment: Mounjaro 5 mg subcutaneously weekly.    Plan:  Continue Mounjaro 5 mg subcutaneously weekly.  Will refill today.  If 5 mg dose is not available, okay 2.5 mg.  She will continue to focus on protein-rich, low simple carbohydrate foods. We reviewed the importance of hydration, regular exercise for stress reduction, and restorative sleep.  - Refill tirzepatide (MOUNJARO) 5 MG/0.5ML Pen; Inject 5 mg into the skin once a week.  Dispense: 2 mL; Refill: 2  2. OSA on CPAP OSA is a cause of systemic hypertension and is associated with an increased incidence of stroke, heart failure, atrial fibrillation, and coronary heart disease. Severe OSA increases all-cause mortality and cardiovascular mortality.   Goal: Treatment of OSA via CPAP compliance and weight loss. Plasma ghrelin levels (appetite or "hunger hormone") are significantly higher in OSA patients than in BMI-matched controls, but decrease to levels similar to those of obese patients without OSA after CPAP treatment.  Weight loss improves OSA by several mechanisms, including reduction in fatty  tissue in the throat (i.e. parapharyngeal fat) and the tongue. Loss of abdominal fat increases mediastinal traction on the upper airway making it less likely to collapse during sleep. Studies have also shown that compliance with CPAP treatment improves leptin (hunger inhibitory hormone) imbalance.  3. Other depression, with emotional eating Controlled. Medication: Effexor-XR 75 mg daily.     Plan:  Continue Effexor-XR. Discussed cues and consequences, how thoughts affect eating, model of thoughts, feelings, and behaviors, and strategies for change by focusing on the cue. Discussed cognitive distortions, coping thoughts, and how to change your thoughts.  4. Obesity with current BMI of 42.5  Course: Brooke Clarke is currently in the action stage of change. As such, her goal is to continue with weight loss efforts.   Nutrition goals: She has agreed to the Category 3 Plan with breakfast options.   Exercise goals:  Increase NEAT.  Behavioral modification strategies: increasing lean protein intake, decreasing simple carbohydrates, increasing vegetables, increasing water intake, decreasing liquid calories, and emotional eating strategies.  Brooke Clarke has agreed to follow-up with our clinic in 4 weeks. She was informed of the importance of frequent follow-up visits to maximize her success with intensive lifestyle modifications for her multiple health conditions.   Objective:   Blood pressure 131/74, pulse (!) 59, temperature 97.8 F (36.6 C), temperature source Oral, height 5\' 3"  (1.6 m), weight 240 lb (108.9 kg), SpO2 97 %. Body mass index is 42.51 kg/m.  General: Cooperative, alert, well developed, in no acute distress. HEENT: Conjunctivae and lids unremarkable. Cardiovascular: Regular rhythm.  Lungs: Normal work of breathing. Neurologic: No focal deficits.   Lab Results  Component Value Date  CREATININE 0.77 04/02/2021   BUN 19 04/02/2021   NA 139 04/02/2021   K 4.9 04/02/2021   CL 103  04/02/2021   CO2 23 04/02/2021   Lab Results  Component Value Date   ALT 22 04/02/2021   AST 17 04/02/2021   ALKPHOS 111 04/02/2021   BILITOT 0.5 04/02/2021   Lab Results  Component Value Date   HGBA1C 5.8 (H) 04/02/2021   HGBA1C 5.8 (H) 12/07/2020   HGBA1C 5.8 02/28/2020   HGBA1C 6.0 05/12/2019   HGBA1C 5.4 06/03/2018   Lab Results  Component Value Date   INSULIN 28.6 (H) 04/02/2021   INSULIN 17.3 12/07/2020   Lab Results  Component Value Date   TSH 2.31 09/12/2020   Lab Results  Component Value Date   CHOL 154 04/02/2021   HDL 52 04/02/2021   LDLCALC 87 04/02/2021   LDLDIRECT 165.0 05/12/2019   TRIG 76 04/02/2021   CHOLHDL 5.2 (H) 12/07/2020   Lab Results  Component Value Date   VD25OH 64.6 04/02/2021   VD25OH 28.3 (L) 12/07/2020   VD25OH 21.79 (L) 05/12/2019   Lab Results  Component Value Date   WBC 6.7 09/12/2020   HGB 14.0 09/12/2020   HCT 41.3 09/12/2020   MCV 89.1 09/12/2020   PLT 195.0 09/12/2020   Lab Results  Component Value Date   IRON 67 03/04/2018   TIBC 360 03/04/2018   FERRITIN 109 03/04/2018   Attestation Statements:   Reviewed by clinician on day of visit: allergies, medications, problem list, medical history, surgical history, family history, social history, and previous encounter notes.  I, Water quality scientist, CMA, am acting as transcriptionist for [name], DO  I have reviewed the above documentation for accuracy and completeness, and I agree with the above. -  [name], DO, MS, FAAFP, DABOM - Family and Bariatric Medicine.

## 2021-07-27 ENCOUNTER — Encounter: Payer: Self-pay | Admitting: Family Medicine

## 2021-07-27 ENCOUNTER — Telehealth: Payer: Self-pay | Admitting: Family Medicine

## 2021-07-27 NOTE — Telephone Encounter (Signed)
Called Ms. Brogan and got her scheduled for 12/19

## 2021-07-27 NOTE — Telephone Encounter (Signed)
Pt needs to schedule an appointment. I sent My chart message this morning saying that. Her infection was 2 1/2 weeks ago need to recheck urine.

## 2021-07-27 NOTE — Telephone Encounter (Signed)
Brooke Clarke called in and wanted to know if she could get more antibiotics for the kidney infection.  And she was seen 11/29 for the issue

## 2021-07-30 ENCOUNTER — Ambulatory Visit: Payer: BC Managed Care – PPO | Admitting: Family Medicine

## 2021-08-13 ENCOUNTER — Encounter: Payer: Self-pay | Admitting: Family Medicine

## 2021-08-14 ENCOUNTER — Ambulatory Visit: Payer: BC Managed Care – PPO | Admitting: Family Medicine

## 2021-08-14 NOTE — Telephone Encounter (Signed)
Appointment has been canceled.

## 2021-08-14 NOTE — Progress Notes (Incomplete)
° °  Brooke Clarke is a 61 y.o. female who presents today for an office visit.  Assessment/Plan:  New/Acute Problems: ***  Chronic Problems Addressed Today: No problem-specific Assessment & Plan notes found for this encounter.     Subjective:  HPI:  She is here to discuss about Cortizone shot.         Objective:  Physical Exam: There were no vitals taken for this visit.  Gen: No acute distress, resting comfortably*** CV: Regular rate and rhythm with no murmurs appreciated Pulm: Normal work of breathing, clear to auscultation bilaterally with no crackles, wheezes, or rhonchi Neuro: Grossly normal, moves all extremities Psych: Normal affect and thought content       I,Savera Zaman,acting as a scribe for Dimas Chyle, MD.,have documented all relevant documentation on the behalf of Dimas Chyle, MD,as directed by  Dimas Chyle, MD while in the presence of Dimas Chyle, MD.   *** Algis Greenhouse. Jerline Pain, MD 08/14/2021 7:50 AM

## 2021-08-16 ENCOUNTER — Other Ambulatory Visit (HOSPITAL_COMMUNITY): Payer: Self-pay

## 2021-09-05 ENCOUNTER — Ambulatory Visit (INDEPENDENT_AMBULATORY_CARE_PROVIDER_SITE_OTHER): Payer: BC Managed Care – PPO | Admitting: Family Medicine

## 2021-10-05 ENCOUNTER — Encounter (INDEPENDENT_AMBULATORY_CARE_PROVIDER_SITE_OTHER): Payer: Self-pay | Admitting: Family Medicine

## 2021-10-08 ENCOUNTER — Ambulatory Visit (INDEPENDENT_AMBULATORY_CARE_PROVIDER_SITE_OTHER): Payer: BC Managed Care – PPO | Admitting: Family Medicine

## 2021-11-06 ENCOUNTER — Encounter: Payer: Self-pay | Admitting: Plastic Surgery

## 2021-11-06 ENCOUNTER — Other Ambulatory Visit: Payer: Self-pay

## 2021-11-06 ENCOUNTER — Ambulatory Visit: Payer: Self-pay | Admitting: Plastic Surgery

## 2021-11-06 VITALS — BP 156/85 | HR 59 | Ht 63.0 in | Wt 249.4 lb

## 2021-11-06 DIAGNOSIS — Z719 Counseling, unspecified: Secondary | ICD-10-CM

## 2021-11-06 NOTE — Progress Notes (Signed)
? ?  Patient ID: Brooke Clarke, female    DOB: 07/22/1961, 61 y.o.   MRN: [id number] ? ? ?Chief Complaint  ?Patient presents with  ? Advice Only  ? Breast Problem  ? ? ?The patient is a 61 year old female here for evaluation of her breasts.  Patient is 5 feet 3 inches tall and weighs 249 pounds.  This is an increase from 235 pounds 3 years ago.  Ten years ago the patient underwent a mastopexy and then had implants placed.  140 cc implants and then was made larger with the moderate classic profile Mentor implant 800 cc on the right and 750 cc on the left.  She complains that they are too large and too heavy.  She has difficulty with exercising, running and physical activity because of the size and weight of the implants.  She is not a smoker and does not have DM.   ? ? ? ?Review of Systems  ?Constitutional:  Positive for activity change. Negative for appetite change.  ?HENT: Negative.    ?Eyes: Negative.   ?Respiratory: Negative.  Negative for chest tightness and shortness of breath.   ?Cardiovascular: Negative.  Negative for leg swelling.  ?Gastrointestinal: Negative.   ?Endocrine: Negative.   ?Musculoskeletal:  Positive for back pain.  ?Skin: Negative.   ?Hematological: Negative.   ?Psychiatric/Behavioral: Negative.    ? ?Past Medical History:  ?Diagnosis Date  ? Asthma   ? Back pain   ? Bladder leak   ? Depression   ? Diabetes mellitus without complication ([place])   ? Elevated blood sugar   ? Heartburn   ? High cholesterol   ? History of breast augmentation 09/03/2018  ? Pateint with Hx of 2 breast surgeries. Now, with pain in neck and breasts due to size. Would like reduction. Will refer to breast surgeon.   ? HSV-2 (herpes simplex virus 2) infection 07/19/2011  ? Positive culture 07/18/11   ? Prediabetes   ? Prediabetes   ? Sleep apnea   ? SOBOE (shortness of breath on exertion)   ? Swallowing difficulty   ? Vitamin D deficiency   ?  ?Past Surgical History:  ?Procedure Laterality Date  ? CHOLECYSTECTOMY    ? FOOT  SURGERY    ? plantar fibromatosis  ? KNEE SURGERY    ?  ? ? ?Current Outpatient Medications:  ?  atorvastatin (LIPITOR) 40 MG tablet, Take 1 tablet (40 mg total) by mouth daily., Disp: 90 tablet, Rfl: 3 ?  CALCIUM PO, Take by mouth., Disp: , Rfl:  ?  ondansetron (ZOFRAN-ODT) 4 MG disintegrating tablet, Dissolve 1 tablet by mouth every 8 hours as needed for nausea or vomiting., Disp: 20 tablet, Rfl: 0 ?  Probiotic Product (PROBIOTIC PO), Probiotic, Disp: , Rfl:  ?  tirzepatide (MOUNJARO) 5 MG/0.5ML Pen, Inject 5 mg into the skin once a week., Disp: 2 mL, Rfl: 2 ?  venlafaxine XR (EFFEXOR-XR) 75 MG 24 hr capsule, TAKE 1 CAPSULE(75 MG) BY MOUTH DAILY WITH BREAKFAST, Disp: 30 capsule, Rfl: 5 ?  pregabalin (LYRICA) 25 MG capsule, Take 1 capsule (25 mg total) by mouth 2 (two) times daily., Disp: 60 capsule, Rfl: 0  ? ?Objective:  ? ?Vitals:  ? 11/06/21 0918  ?BP: (!) 156/85  ?Pulse: (!) 59  ?SpO2: 96%  ? ? ?Physical Exam ?Vitals reviewed.  ?Constitutional:   ?   Appearance: Normal appearance.  ?HENT:  ?   Head: Normocephalic and atraumatic.  ?Cardiovascular:  ?   Rate  and Rhythm: Normal rate.  ?   Pulses: Normal pulses.  ?Pulmonary:  ?   Effort: Pulmonary effort is normal.  ?Abdominal:  ?   Palpations: Abdomen is soft.  ?Musculoskeletal:     ?   General: No swelling or deformity.  ?Skin: ?   General: Skin is warm.  ?   Capillary Refill: Capillary refill takes less than 2 seconds.  ?   Coloration: Skin is not jaundiced.  ?   Findings: No bruising.  ?Neurological:  ?   Mental Status: She is alert and oriented to person, place, and time.  ?Psychiatric:     ?   Mood and Affect: Mood normal.     ?   Behavior: Behavior normal.     ?   Thought Content: Thought content normal.     ?   Judgment: Judgment normal.  ? ? ?Assessment & Plan:  ?Encounter for counseling ? ?Removal of bilateral breast implant and mastopexy.  We discussed that she will look much different without the implants.  I just want her to be prepared for the  change.  Implants could be placed at a later time if desired.   ? ?Pictures were obtained of the patient and placed in the chart with the patient's or guardian's permission.' ? ? ? ?Loel Lofty Darriona Dehaas, DO ?

## 2021-11-12 ENCOUNTER — Other Ambulatory Visit (HOSPITAL_COMMUNITY): Payer: Self-pay

## 2021-11-12 ENCOUNTER — Encounter (INDEPENDENT_AMBULATORY_CARE_PROVIDER_SITE_OTHER): Payer: Self-pay | Admitting: Family Medicine

## 2021-11-12 ENCOUNTER — Ambulatory Visit (INDEPENDENT_AMBULATORY_CARE_PROVIDER_SITE_OTHER): Payer: BC Managed Care – PPO | Admitting: Family Medicine

## 2021-11-12 VITALS — BP 149/79 | HR 58 | Temp 97.8°F | Ht 63.0 in | Wt 248.0 lb

## 2021-11-12 DIAGNOSIS — Z6841 Body Mass Index (BMI) 40.0 and over, adult: Secondary | ICD-10-CM

## 2021-11-12 DIAGNOSIS — E559 Vitamin D deficiency, unspecified: Secondary | ICD-10-CM | POA: Diagnosis not present

## 2021-11-12 DIAGNOSIS — E7849 Other hyperlipidemia: Secondary | ICD-10-CM | POA: Diagnosis not present

## 2021-11-12 DIAGNOSIS — R7301 Impaired fasting glucose: Secondary | ICD-10-CM

## 2021-11-12 DIAGNOSIS — R632 Polyphagia: Secondary | ICD-10-CM

## 2021-11-12 DIAGNOSIS — F3289 Other specified depressive episodes: Secondary | ICD-10-CM

## 2021-11-12 DIAGNOSIS — E669 Obesity, unspecified: Secondary | ICD-10-CM

## 2021-11-12 DIAGNOSIS — R5383 Other fatigue: Secondary | ICD-10-CM

## 2021-11-12 MED ORDER — TIRZEPATIDE 2.5 MG/0.5ML ~~LOC~~ SOAJ: 2.5000 mg | SUBCUTANEOUS | 0 refills | Status: DC

## 2021-11-12 MED ORDER — WEGOVY 0.25 MG/0.5ML ~~LOC~~ SOAJ: 0.2500 mg | SUBCUTANEOUS | 0 refills | Status: DC

## 2021-11-12 MED ORDER — TIRZEPATIDE 2.5 MG/0.5ML ~~LOC~~ SOAJ
2.5000 mg | SUBCUTANEOUS | 0 refills | Status: DC
  Filled 2021-11-12: qty 2, 28d supply, fill #0

## 2021-11-12 MED ORDER — PHENTERMINE HCL 37.5 MG PO TABS: 18.7500 mg | ORAL_TABLET | Freq: Every day | ORAL | 0 refills | Status: DC

## 2021-11-14 ENCOUNTER — Encounter (INDEPENDENT_AMBULATORY_CARE_PROVIDER_SITE_OTHER): Payer: Self-pay

## 2021-11-14 ENCOUNTER — Telehealth (INDEPENDENT_AMBULATORY_CARE_PROVIDER_SITE_OTHER): Payer: Self-pay | Admitting: Family Medicine

## 2021-11-14 NOTE — Telephone Encounter (Signed)
Prior authorization denied for [facility]. Per insurance: not a covered benefit, does not cover any weight loss. Patient sent denial message via mychart.  ?

## 2021-11-19 NOTE — Progress Notes (Signed)
Chief Complaint:   OBESITY Brooke Clarke is here to discuss her progress with her obesity treatment plan along with follow-up of her obesity related diagnoses. See Medical Weight Management Flowsheet for complete bioelectrical impedance results.  Today's visit was #: 7 Starting weight: 244 lbs Starting date: 12/07/2020 Weight change since last visit: +8 lbs Total lbs lost to date: +4 lbs  Nutrition Plan: Category 3 Plan with breakfast options for 0% of the time.  Activity: Crossfit for 30 minutes 2 times per week. Anti-obesity medications: Mounjaro 5 mg subcutaneously weekly. Reported side effects: None.  Interim History: Brooke Clarke says she is starting 56 days.  She is currently taking Effexor and is sleeping from 10-7.  She has been off her statin since January.  Will order fasting labs today and she will come in this week.  She says that her eating patterns are: Breakfast:  Cereal - Special K Protein, fruit, milk. Snack:  Grapes/Cheese. Lunch:  Has not had yet.  Assessment/Plan:   1. Impaired fasting glucose Brooke Clarke is currently taking Mounjaro 2.5 mg subcutaneously weekly. She will continue to focus on protein-rich, low simple carbohydrate foods. We reviewed the importance of hydration, regular exercise for stress reduction, and restorative sleep.   - Refill tirzepatide (MOUNJARO) 2.5 MG/0.5ML Pen; Inject 2.5 mg into the skin once a week.  Dispense: 2 mL; Refill: 0 - tirzepatide (MOUNJARO) 2.5 MG/0.5ML Pen; Inject 2.5 mg into the skin once a week.  Dispense: 2 mL; Refill: 0 - Comprehensive metabolic panel - Hemoglobin A1c - Insulin, random  2. Polyphagia Not at goal. Current treatment: None.    Plan:  Start phentermine 37.5 mg 1/2 to 1 tablet daily. She will continue to focus on protein-rich, low simple carbohydrate foods. We reviewed the importance of hydration, regular exercise for stress reduction, and restorative sleep.   - Start phentermine (ADIPEX-P) 37.5 MG tablet; Take 0.5-1  tablets (18.75-37.5 mg total) by mouth daily before breakfast.  Dispense: 30 tablet; Refill: 0  3. Vitamin D deficiency At goal.   Plan: Continue to take prescription Vitamin D '@50'$ ,000 IU every week as prescribed.  Will check vitamin D level.  Lab Results  Component Value Date   VD25OH 64.6 04/02/2021   VD25OH 28.3 (L) 12/07/2020   VD25OH 21.79 (L) 05/12/2019   - VITAMIN D 25 Hydroxy (Vit-D Deficiency, Fractures)  4. Other hyperlipidemia Course: At goal. Lipid-lowering medications: None.   Plan: Dietary changes: Increase soluble fiber, decrease simple carbohydrates, decrease saturated fat. Exercise changes: Moderate to vigorous-intensity aerobic activity 150 minutes per week or as tolerated. We will continue to monitor along with PCP/specialists as it pertains to her weight loss journey.  Lab Results  Component Value Date   CHOL 154 04/02/2021   HDL 52 04/02/2021   LDLCALC 87 04/02/2021   LDLDIRECT 165.0 05/12/2019   TRIG 76 04/02/2021   CHOLHDL 5.2 (H) 12/07/2020   Lab Results  Component Value Date   ALT 22 04/02/2021   AST 17 04/02/2021   ALKPHOS 111 04/02/2021   BILITOT 0.5 04/02/2021   The 10-year ASCVD risk score (Arnett DK, et al., 2019) is: 4.7%   Values used to calculate the score:     Age: 61 years     Sex: Female     Is Non-Hispanic African American: No     Diabetic: No     Tobacco smoker: No     Systolic Blood Pressure: 240 mmHg     Is BP treated: No  HDL Cholesterol: 52 mg/dL     Total Cholesterol: 154 mg/dL  - Lipid Panel With LDL/HDL Ratio  5. Other fatigue Brooke Clarke endorses more fatigue than usual recently.  Will place future orders for CBC and TSH.  - CBC with Differential/Platelet - TSH  6. Other depression, with emotional eating Controlled. Medication: Effexor-XR 75 mg daily.   Plan: Discussed cues and consequences, how thoughts affect eating, model of thoughts, feelings, and behaviors, and strategies for change by focusing on the cue.  Discussed cognitive distortions, coping thoughts, and how to change your thoughts.  7. Obesity, current BMI 44  - Start Semaglutide-Weight Management (WEGOVY) 0.25 MG/0.5ML SOAJ; Inject 0.25 mg into the skin once a week.  Dispense: 2 mL; Refill: 0  Course: Brooke Clarke is currently in the action stage of change. As such, her goal is to continue with weight loss efforts.   Nutrition goals: She has agreed to 56 Days Better.   Exercise goals: All adults should avoid inactivity. Some physical activity is better than none, and adults who participate in any amount of physical activity gain some health benefits.  Behavioral modification strategies: increasing lean protein intake, decreasing simple carbohydrates, increasing vegetables, and increasing water intake.  Brooke Clarke has agreed to follow-up with our clinic in 6 weeks. She was informed of the importance of frequent follow-up visits to maximize her success with intensive lifestyle modifications for her multiple health conditions.   Brooke Clarke was informed we would discuss her lab results at her next visit unless there is a critical issue that needs to be addressed sooner. Brooke Clarke agreed to keep her next visit at the agreed upon time to discuss these results.  Objective:   Blood pressure (!) 149/79, pulse (!) 58, temperature 97.8 F (36.6 C), temperature source Oral, height '5\' 3"'$  (1.6 m), weight 248 lb (112.5 kg), SpO2 97 %. Body mass index is 43.93 kg/m.  General: Cooperative, alert, well developed, in no acute distress. HEENT: Conjunctivae and lids unremarkable. Cardiovascular: Regular rhythm.  Lungs: Normal work of breathing. Neurologic: No focal deficits.   Lab Results  Component Value Date   CREATININE 0.77 04/02/2021   BUN 19 04/02/2021   NA 139 04/02/2021   K 4.9 04/02/2021   CL 103 04/02/2021   CO2 23 04/02/2021   Lab Results  Component Value Date   ALT 22 04/02/2021   AST 17 04/02/2021   ALKPHOS 111 04/02/2021   BILITOT 0.5  04/02/2021   Lab Results  Component Value Date   HGBA1C 5.8 (H) 04/02/2021   HGBA1C 5.8 (H) 12/07/2020   HGBA1C 5.8 02/28/2020   HGBA1C 6.0 05/12/2019   HGBA1C 5.4 06/03/2018   Lab Results  Component Value Date   INSULIN 28.6 (H) 04/02/2021   INSULIN 17.3 12/07/2020   Lab Results  Component Value Date   TSH 2.31 09/12/2020   Lab Results  Component Value Date   CHOL 154 04/02/2021   HDL 52 04/02/2021   LDLCALC 87 04/02/2021   LDLDIRECT 165.0 05/12/2019   TRIG 76 04/02/2021   CHOLHDL 5.2 (H) 12/07/2020   Lab Results  Component Value Date   VD25OH 64.6 04/02/2021   VD25OH 28.3 (L) 12/07/2020   VD25OH 21.79 (L) 05/12/2019   Lab Results  Component Value Date   WBC 6.7 09/12/2020   HGB 14.0 09/12/2020   HCT 41.3 09/12/2020   MCV 89.1 09/12/2020   PLT 195.0 09/12/2020   Lab Results  Component Value Date   IRON 67 03/04/2018   TIBC 360 03/04/2018  FERRITIN 109 03/04/2018   Attestation Statements:   Reviewed by clinician on day of visit: allergies, medications, problem list, medical history, surgical history, family history, social history, and previous encounter notes.  Time spent on visit including pre-visit chart review and post-visit care and documentation was 43 minutes. Time was spent on: Food choices and timing of food intake reviewed today. I discussed a personalized meal plan with the patient that will help her to lose weight and will improve her obesity-related conditions going forward. I performed a medically necessary appropriate examination and/or evaluation. I discussed the assessment and treatment plan with the patient. Motivational interviewing as well as evidence-based interventions for health behavior change were utilized today including the discussion of self monitoring techniques, problem-solving barriers and SMART goal setting techniques.  An exercise prescription was reviewed.  The patient was provided an opportunity to ask questions and all were  answered. The patient agreed with the plan and demonstrated an understanding of the instructions. Labs were ordered at this visit and will be reviewed at the next visit unless more critical results need to be addressed immediately. Clinical information was updated and documented in the EMR.  I, Water quality scientist, CMA, am acting as transcriptionist for [name], DO  I have reviewed the above documentation for accuracy and completeness, and I agree with the above. -  [name], DO, MS, FAAFP, DABOM - Family and Bariatric Medicine.

## 2021-11-29 DIAGNOSIS — Z96651 Presence of right artificial knee joint: Secondary | ICD-10-CM | POA: Diagnosis not present

## 2021-12-07 ENCOUNTER — Encounter: Payer: Self-pay | Admitting: Family Medicine

## 2021-12-07 ENCOUNTER — Other Ambulatory Visit: Payer: Self-pay

## 2021-12-07 ENCOUNTER — Telehealth: Payer: Self-pay | Admitting: Family Medicine

## 2021-12-07 ENCOUNTER — Ambulatory Visit (INDEPENDENT_AMBULATORY_CARE_PROVIDER_SITE_OTHER): Payer: BC Managed Care – PPO | Admitting: Family Medicine

## 2021-12-07 VITALS — BP 130/78 | HR 63 | Temp 98.0°F | Ht 63.0 in | Wt 249.4 lb

## 2021-12-07 DIAGNOSIS — H43399 Other vitreous opacities, unspecified eye: Secondary | ICD-10-CM | POA: Diagnosis not present

## 2021-12-07 DIAGNOSIS — F339 Major depressive disorder, recurrent, unspecified: Secondary | ICD-10-CM | POA: Diagnosis not present

## 2021-12-07 NOTE — Telephone Encounter (Signed)
Patient Name: Brooke Clarke Gender: Female DOB: 05-14-1961 Age: 61 Y 9 M 20 D Return Phone Number: [phone] (Primary) Address: City/ State/ Zip: [place]  [address] Client [place] [facility] Client Site [place] [facility] [place] Day Provider Dimas Chyle- MD Contact Type Call Who Is Calling Patient / Member / Family / Caregiver Call Type Triage / Clinical Relationship To Patient Self Return Phone Number [phone] (Primary) Chief Complaint Eye Redness Reason for Call Symptomatic / Request for [place] has a red eyeball, no blood, states may be a broken vessel in her right eye. Translation No Nurse Assessment Nurse: Alveta Heimlich, RN, Rise Paganini Date/Time (Eastern Time): 12/07/2021 9:14:54 AM Confirm and document reason for call. If symptomatic, describe symptoms. ---Caller states she noticed yesterday that her right eye is bloody looking. When she got up this am, half of her eye looks bloody. No drainage. No fever. No pain. Does the patient have any new or worsening symptoms? ---Yes Will a triage be completed? ---Yes Related visit to physician within the last 2 weeks? ---No Does the PT have any chronic conditions? (i.e. diabetes, asthma, this includes High risk factors for pregnancy, etc.) ---Yes List chronic conditions. ---prediabetic Is this a behavioral health or substance abuse call? ---No Guidelines Guideline Title Affirmed Question Affirmed Notes Nurse Date/Time (Eastern Time) Eye - Red Without Pus Bleeding on white of the eye Alveta Heimlich, RN, [facility] 12/07/2021 9:17:42 AM Disp. Time Eilene Ghazi Time) Disposition Final User 12/07/2021 9:20:33 AM SEE PCP WITHIN 3 DAYS Yes Alveta Heimlich, RN, Ali Lowe Disagree/Comply Comply Caller Understands Yes PreDisposition Call Doctor Care Advice Given Per Guideline SEE PCP WITHIN 3 DAYS: * You need to be seen within 2 or 3 days. * You become worse * Pus  or discharge occurs * Blurred vision occurs * Severe or increasing pain CALL BACK IF: CARE ADVICE given per Eye - Red without Pus (Adult) guideline. REASSURANCE AND EDUCATION - POSSIBLE SUBCONJUNCTIVAL HEMORRHAGE: Referrals REFERRED TO PCP OFFICE

## 2021-12-07 NOTE — Telephone Encounter (Signed)
Patient noticed a red Right eye ball- stated she sees blood but no actual blood coming out - thinks its a broken vessel- patient being triaged- awaiting notes-  ?

## 2021-12-07 NOTE — Assessment & Plan Note (Signed)
Last ophthalmology a couple of years ago.  We will place a new referral today. ?

## 2021-12-07 NOTE — Assessment & Plan Note (Signed)
Patient has been under a lot of stress recently due to several life events.  She is interested in seeing a therapist and will place a referral today.  We will also increase her dose of Effexor to 150 mg daily.  She will send me a message in a few weeks via MyChart to let me know how this is working.  No reported SI or HI today. ?

## 2021-12-07 NOTE — Patient Instructions (Signed)
It was very nice to see you today! ? ?The redness in your eye is a benign condition called a subconjunctival hemorrhage.  This should go away within the next week or so. ? ?Please increase your Effexor to 150 mg daily.  I will refer you to see a therapist within [place]. ? ?I will refer you to see your eye doctor as well. ? ?Please send a message in a few weeks to let me know how you are doing. ? ?Take care, ?Dr Jerline Pain ? ?PLEASE NOTE: ? ?If you had any lab tests please let us know if you have not heard back within a few days. You may see your results on mychart before we have a chance to review them but we will give you a call once they are reviewed by [place]. If we ordered any referrals today, please let us know if you have not heard from their office within the next week.  ? ?Please try these tips to maintain a healthy lifestyle: ? ?Eat at least 3 REAL meals and 1-2 snacks per day.  Aim for no more than 5 hours between eating.  If you eat breakfast, please do so within one hour of getting up.  ? ?Each meal should contain half fruits/vegetables, one quarter protein, and one quarter carbs (no bigger than a computer mouse) ? ?Cut down on sweet beverages. This includes juice, soda, and sweet tea.  ? ?Drink at least 1 glass of water with each meal and aim for at least 8 glasses per day ? ?Exercise at least 150 minutes every week.   ?

## 2021-12-07 NOTE — Progress Notes (Signed)
? ?  Brooke Clarke is a 61 y.o. female who presents today for an office visit. ? ?Assessment/Plan:  ?New/Acute Problems: ?Subconjunctival hemorrhage ?No red flags.  Reassured patient.  She can continue to use cold compresses as needed. ? ?Chronic Problems Addressed Today: ?Depression, recurrent ([place]) ?Patient has been under a lot of stress recently due to several life events.  She is interested in seeing a therapist and will place a referral today.  We will also increase her dose of Effexor to 150 mg daily.  She will send me a message in a few weeks via MyChart to let me know how this is working.  No reported SI or HI today. ? ?Floaters ?Last ophthalmology a couple of years ago.  We will place a new referral today. ? ? ?  ?Subjective:  ?HPI: ? ?Patient here with redness to her right eye. First noticed yesterday after waking up.  No Clarke.  No trauma.  No treatments tried.  Has gotten little bit worse today.  No vision changes. ? ?See A/P for status of chronic conditions. ?   ?  ?Objective:  ?Physical Exam: ?BP 130/78   Pulse 63   Temp 98 ?F (36.7 ?C)   Ht '5\' 3"'$  (1.6 m)   Wt 249 lb 6.4 oz (113.1 kg)   SpO2 95%   BMI 44.18 kg/m?   ?Gen: No acute distress, resting comfortably ?HEENT: Some conjunctival hemorrhage noted to the medial aspect of right eye.  Visual acuity grossly intact.  No Clarke with extraocular eye movements. ?CV: Regular rate and rhythm with no murmurs appreciated ?Pulm: Normal work of breathing, clear to auscultation bilaterally with no crackles, wheezes, or rhonchi ?Neuro: Grossly normal, moves all extremities ?Psych: Normal affect and thought content ? ?   ? ?Brooke Clarke. Brooke Pain, MD ?12/07/2021 11:43 AM  ?

## 2021-12-07 NOTE — Telephone Encounter (Signed)
Noted. She is coming in for visit this morning. ? ?Algis Greenhouse. Jerline Pain, MD ?12/07/2021 9:53 AM  ? ?

## 2021-12-07 NOTE — Telephone Encounter (Signed)
Pt was seen in office today

## 2021-12-07 NOTE — Telephone Encounter (Signed)
FYI

## 2021-12-18 DIAGNOSIS — D4989 Neoplasm of unspecified behavior of other specified sites: Secondary | ICD-10-CM | POA: Diagnosis not present

## 2021-12-18 DIAGNOSIS — M722 Plantar fascial fibromatosis: Secondary | ICD-10-CM | POA: Diagnosis not present

## 2021-12-19 ENCOUNTER — Other Ambulatory Visit: Payer: Self-pay | Admitting: *Deleted

## 2021-12-19 ENCOUNTER — Encounter: Payer: Self-pay | Admitting: Family Medicine

## 2021-12-19 ENCOUNTER — Telehealth: Payer: Self-pay | Admitting: Family Medicine

## 2021-12-19 MED ORDER — VENLAFAXINE HCL ER 150 MG PO CP24: 150.0000 mg | ORAL_CAPSULE | Freq: Every day | ORAL | 1 refills | Status: DC

## 2021-12-19 NOTE — Telephone Encounter (Signed)
Please advise 

## 2021-12-19 NOTE — Telephone Encounter (Signed)
Yes she should be on '150mg'$  daily. Please send in new rx. ? ?Algis Greenhouse. Jerline Pain, MD ?12/19/2021 10:43 AM  ? ?

## 2021-12-19 NOTE — Telephone Encounter (Signed)
Called Dr Lucita Ferrara office and scheduled appt for patient; was told by office if patient needed to reschedule her appt she could contact their office directly. Advised pt of appt date and time, Brooke Clarke verbalized understanding.  ?

## 2021-12-19 NOTE — Telephone Encounter (Signed)
Patient stated she was told dr Jerline Pain would increase her Effexor to 150 mg daily and it was not called in -  ? ? ?I provided patient with Ophthalmology referral information and patient will call to sch -  ?

## 2021-12-24 ENCOUNTER — Encounter: Payer: Self-pay | Admitting: Family Medicine

## 2021-12-25 ENCOUNTER — Other Ambulatory Visit: Payer: Self-pay | Admitting: *Deleted

## 2021-12-25 MED ORDER — VENLAFAXINE HCL ER 75 MG PO CP24: 75.0000 mg | ORAL_CAPSULE | Freq: Every day | ORAL | 0 refills | Status: DC

## 2021-12-25 NOTE — Telephone Encounter (Signed)
Please advise 

## 2021-12-25 NOTE — Telephone Encounter (Signed)
Ok to send in '75mg'$  tablet. I would like for her to follow up with [place] soon if she is wanting to come off her medication. ? ?Algis Greenhouse. Jerline Pain, MD ?12/25/2021 10:27 AM  ? ?

## 2022-01-25 ENCOUNTER — Encounter: Payer: Self-pay | Admitting: Family

## 2022-01-25 ENCOUNTER — Telehealth: Payer: Self-pay | Admitting: Family Medicine

## 2022-01-25 ENCOUNTER — Ambulatory Visit (INDEPENDENT_AMBULATORY_CARE_PROVIDER_SITE_OTHER): Payer: BC Managed Care – PPO | Admitting: Family

## 2022-01-25 VITALS — BP 136/88 | HR 73 | Temp 98.0°F | Resp 22 | Ht 63.0 in | Wt 268.0 lb

## 2022-01-25 DIAGNOSIS — R051 Acute cough: Secondary | ICD-10-CM | POA: Diagnosis not present

## 2022-01-25 DIAGNOSIS — J209 Acute bronchitis, unspecified: Secondary | ICD-10-CM

## 2022-01-25 LAB — POC COVID19 BINAXNOW: SARS Coronavirus 2 Ag: NEGATIVE

## 2022-01-25 MED ORDER — ALBUTEROL SULFATE HFA 108 (90 BASE) MCG/ACT IN AERS: 2.0000 | INHALATION_SPRAY | Freq: Four times a day (QID) | RESPIRATORY_TRACT | 0 refills | Status: AC | PRN

## 2022-01-25 MED ORDER — PREDNISONE 20 MG PO TABS: 20.0000 mg | ORAL_TABLET | Freq: Every day | ORAL | 0 refills | Status: DC

## 2022-01-25 MED ORDER — AZITHROMYCIN 250 MG PO TABS: ORAL_TABLET | ORAL | 0 refills | Status: DC

## 2022-01-25 MED ORDER — HYDROCOD POLI-CHLORPHE POLI ER 10-8 MG/5ML PO SUER: 5.0000 mL | Freq: Two times a day (BID) | ORAL | 0 refills | Status: DC | PRN

## 2022-01-25 NOTE — Telephone Encounter (Signed)
Please see triage note for Brooke Clarke pt

## 2022-01-25 NOTE — Telephone Encounter (Signed)
Pt will go to [facility]  Patient Name: [facility] Gender: Female DOB: Jun 17, 1961 Age: 61 Y 11 M 8 D Return Phone Number: [phone] (Primary) Address: City/ State/ Zip: [place]  [address] Client [place] [facility] [place] Client Site [place] [facility] [place] Day Provider Dimas Chyle- MD Contact Type Call Who Is Calling Patient / Member / Family / Caregiver Call Type Triage / Clinical Relationship To Patient Self Return Phone Number [phone] (Primary) Chief Complaint BREATHING - shortness of breath or sounds breathless Reason for Call Symptomatic / Request for [place] states she is experiencing a deep cough with shortness of breath. Translation No Nurse Assessment Nurse: Lucky Cowboy, RN, Levada Dy Date/Time (Eastern Time): 01/25/2022 8:46:43 AM Confirm and document reason for call. If symptomatic, describe symptoms. ---Caller stated that she has a cough and shortness of breath with exertion. She voiced that this was when she did too much. Her s/s started Wed. No fever. Does the patient have any new or worsening symptoms? ---Yes Will a triage be completed? ---Yes Related visit to physician within the last 2 weeks? ---No Does the PT have any chronic conditions? (i.e. diabetes, asthma, this includes High risk factors for pregnancy, etc.) ---Yes List chronic conditions. ---depression Is this a behavioral health or substance abuse call? ---No Guidelines Guideline Title Affirmed Question Affirmed Notes Nurse Date/Time (Eastern Time) Cough - Acute NonProductive [1] MILD difficulty breathing (e.g., minimal/no SOB at rest, SOB with walking, pulse <100) AND [2] still present when not coughing Dew, RN, Levada Dy 01/25/2022 8:48:59 AM PLEASE NOTE: All timestamps contained within this report are represented as [place] Standard Time. CONFIDENTIALTY NOTICE: This fax transmission is intended only for the  addressee. It contains information that is legally privileged, confidential or otherwise protected from use or disclosure. If you are not the intended recipient, you are strictly prohibited from reviewing, disclosing, copying using or disseminating any of this information or taking any action in reliance on or regarding this information. If you have received this fax in error, please notify us immediately by telephone so that we can arrange for its return to [place]. Phone: [phone], Toll-Free: [phone], Fax: [phone] Page: 2 of 2 Call Id: [id number] [place]. Time Eilene Ghazi Time) Disposition Final User 01/25/2022 8:43:09 AM Send to Urgent Queue Memory Argue 01/25/2022 8:55:43 AM See HCP within 4 Hours (or PCP triage) Yes Dew, RN, Marin Shutter Disagree/Comply Comply Caller Understands Yes PreDisposition Did not know what to do Care Advice Given Per Guideline SEE HCP (OR PCP TRIAGE) WITHIN 4 HOURS: * IF OFFICE WILL BE OPEN: You need to be seen within the next 3 or 4 hours. Call your doctor (or NP/PA) now or as soon as the office opens. CALL BACK IF: * You become worse CARE ADVICE given per Cough - Acute Non-Productive (Adult) guideline. Comments User: Raford Pitcher, RN Date/Time Eilene Ghazi Time): 01/25/2022 8:56:28 AM Caller stated that she doesn't know which [facility] she will go to or if she's going to one. She's going to call the office back about any cancellation lists. Referrals GO TO FACILITY UNDECIDE

## 2022-01-25 NOTE — Telephone Encounter (Signed)
Pt scheduled for appt today at 11am w/ NP Dorthula Nettles

## 2022-01-25 NOTE — Progress Notes (Signed)
BRISIA SCHUERMANN is a 61 y.o. female with the following history as recorded in [employer]:  Patient Active Problem List   Diagnosis Date Noted   Floaters 12/07/2021   At risk for impaired metabolic function 46/65/9935   Other hyperlipidemia 12/07/2020   Mood disorder ([place]) 12/07/2020   Prediabetes 12/07/2020   Other fatigue 12/07/2020   SOBOE (shortness of breath on exertion) 12/07/2020   At risk for heart disease 12/07/2020   Xerosis cutis 09/12/2020   Hyperglycemia 07/06/2019   Vitamin D deficiency 07/06/2019   Morbid obesity ([place]) 06/10/2019   Dyslipidemia 06/10/2019   Encounter for counseling 09/15/2018   OSA on CPAP 03/10/2018   Osteoarthritis 10/28/2017   Spondylosis of lumbar joint 07/31/2017   Elevated blood pressure reading 03/05/2010   Depression, recurrent ([place]) 01/23/2010    Current Outpatient Medications  Medication Sig Dispense Refill   albuterol (VENTOLIN HFA) 108 (90 Base) MCG/ACT inhaler Inhale 2 puffs into the lungs every 6 (six) hours as needed for wheezing or shortness of breath. 8 g 0   chlorpheniramine-HYDROcodone (TUSSIONEX PENNKINETIC ER) 10-8 MG/5ML Take 5 mLs by mouth every 12 (twelve) hours as needed for cough. 70 mL 0   predniSONE (DELTASONE) 20 MG tablet Take 1 tablet (20 mg total) by mouth daily with breakfast. 5 tablet 0   venlafaxine XR (EFFEXOR XR) 75 MG 24 hr capsule Take 1 capsule (75 mg total) by mouth daily with breakfast. 30 capsule 0   azithromycin (ZITHROMAX) 250 MG tablet 2 po on day one, then one po daily until gone 6 tablet 0   CALCIUM PO Take by mouth. (Patient not taking: Reported on 01/25/2022)     phentermine (ADIPEX-P) 37.5 MG tablet Take 0.5-1 tablets (18.75-37.5 mg total) by mouth daily before breakfast. (Patient not taking: Reported on 01/25/2022) 30 tablet 0   Probiotic Product (PROBIOTIC PO) Probiotic (Patient not taking: Reported on 01/25/2022)     No current facility-administered medications for this visit.    Allergies: Patient has  no active allergies.  Past Medical History:  Diagnosis Date   Asthma    Back pain    Bladder leak    Depression    Diabetes mellitus without complication (HCC)    Elevated blood sugar    Heartburn    High cholesterol    History of breast augmentation 09/03/2018   Pateint with Hx of 2 breast surgeries. Now, with pain in neck and breasts due to size. Would like reduction. Will refer to breast surgeon.    HSV-2 (herpes simplex virus 2) infection 07/19/2011   Positive culture 07/18/11    Prediabetes    Prediabetes    Sleep apnea    SOBOE (shortness of breath on exertion)    Swallowing difficulty    Vitamin D deficiency     Past Surgical History:  Procedure Laterality Date   CHOLECYSTECTOMY     FOOT SURGERY     plantar fibromatosis   KNEE SURGERY      Family History  Problem Relation Age of Onset   Diabetes Father    Hyperlipidemia Father    Hypertension Father    Heart disease Father    Sudden death Father    Sleep apnea Father    Obesity Father    Stroke Other    Melanoma Mother    Cancer Mother     Social History   Tobacco Use   Smoking status: Never   Smokeless tobacco: Never  Substance Use Topics   Alcohol use: Not  Currently    Subjective:   Cough/ congestion x 2 days; notes that is prone to bronchitis at least once per year; no fever; no OTC medication;  Requesting that allergy for Hydrocodone be removed- she notes that only Hydrocodone cough syrup works for her night time cough;     Objective:  Vitals:   01/25/22 1118  BP: 136/88  Pulse: 73  Resp: (!) 22  Temp: 98 F (36.7 C)  SpO2: 97%  Weight: 268 lb (121.6 kg)  Height: '5\' 3"'$  (1.6 m)    General: Well developed, well nourished, in no acute distress  Skin : Warm and dry.  Head: Normocephalic and atraumatic  Eyes: Sclera and conjunctiva clear; pupils round and reactive to light; extraocular movements intact  Ears: External normal; canals clear; tympanic membranes normal  Oropharynx: Pink,  supple. No suspicious lesions  Neck: Supple without thyromegaly, adenopathy  Lungs: Respirations unlabored; occasional wheeze noted; Vessels: Symmetric bilaterally  Neurologic: Alert and oriented; speech intact; face symmetrical; moves all extremities well; CNII-XII intact without focal deficit  Assessment:  1. Acute cough   2. Acute bronchitis, unspecified organism     Plan:  Rx for Z-pak #1 take as directed; Rx for Prednisone 20 mg qd x 5 days; Rx for albuterol and Tussionex; increase fluids, rest; if symptoms persist or worsen, she will need to see her PCP next week;   No follow-ups on file.  Orders Placed This Encounter  Procedures   POC COVID-19 [facility]    Order Specific Question:   Previously tested for COVID-19    Answer:   No    Order Specific Question:   Resident in a congregate (group) care setting    Answer:   No    Order Specific Question:   Employed in healthcare setting    Answer:   No    Order Specific Question:   Pregnant    Answer:   No    Requested Prescriptions   Signed Prescriptions Disp Refills   albuterol (VENTOLIN HFA) 108 (90 Base) MCG/ACT inhaler 8 g 0    Sig: Inhale 2 puffs into the lungs every 6 (six) hours as needed for wheezing or shortness of breath.   predniSONE (DELTASONE) 20 MG tablet 5 tablet 0    Sig: Take 1 tablet (20 mg total) by mouth daily with breakfast.   azithromycin (ZITHROMAX) 250 MG tablet 6 tablet 0    Sig: 2 po on day one, then one po daily until gone   chlorpheniramine-HYDROcodone (TUSSIONEX PENNKINETIC ER) 10-8 MG/5ML 70 mL 0    Sig: Take 5 mLs by mouth every 12 (twelve) hours as needed for cough.

## 2022-01-25 NOTE — Telephone Encounter (Signed)
It appears patient has visit with Jodi Mourning, NP today at 11 am- I assume that was the disposition after the visit/triage?   If they recommend be seen within 4 hours- please make sure they have been scheduled or at least attempted to be scheduled- thanks!

## 2022-01-25 NOTE — Telephone Encounter (Signed)
Patient called in stating she had been experiencing a deep cough with a whistling noise since Wednesday 01/23/22. She believes this to be the start of bronchitis especially since she is now experiencing slight shortness of breath. Due to symptoms I have sent patient to triage for further evaluation.

## 2022-02-13 ENCOUNTER — Other Ambulatory Visit: Payer: Self-pay | Admitting: Family Medicine

## 2022-02-13 ENCOUNTER — Encounter: Payer: Self-pay | Admitting: Family Medicine

## 2022-02-13 MED ORDER — VENLAFAXINE HCL ER 75 MG PO CP24: 75.0000 mg | ORAL_CAPSULE | Freq: Every day | ORAL | 0 refills | Status: DC

## 2022-02-13 NOTE — Telephone Encounter (Signed)
Agree with her coming off the effexor if she wishes. Ok to send in new rx. I would like for her to check in with [place] in 1-2 weeks.  Algis Greenhouse. Jerline Pain, MD 02/13/2022 11:29 AM

## 2022-02-20 DIAGNOSIS — H43813 Vitreous degeneration, bilateral: Secondary | ICD-10-CM | POA: Diagnosis not present

## 2022-02-22 ENCOUNTER — Ambulatory Visit: Payer: BC Managed Care – PPO | Admitting: Family Medicine

## 2022-02-22 ENCOUNTER — Telehealth: Payer: Self-pay | Admitting: Family Medicine
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
## 2022-03-04 DIAGNOSIS — F4323 Adjustment disorder with mixed anxiety and depressed mood: Secondary | ICD-10-CM | POA: Diagnosis not present

## 2022-03-18 ENCOUNTER — Encounter: Payer: Self-pay | Admitting: Family Medicine

## 2022-03-18 ENCOUNTER — Other Ambulatory Visit: Payer: Self-pay | Admitting: Family Medicine

## 2022-03-18 MED ORDER — VENLAFAXINE HCL ER 75 MG PO CP24: 75.0000 mg | ORAL_CAPSULE | Freq: Every day | ORAL | 0 refills | Status: DC

## 2022-03-19 NOTE — Telephone Encounter (Signed)
See note

## 2022-03-19 NOTE — Telephone Encounter (Signed)
Ok to take the '150mg'$  dose off her med list.  Algis Greenhouse. Jerline Pain, MD 03/19/2022 8:49 AM

## 2022-03-20 ENCOUNTER — Encounter (INDEPENDENT_AMBULATORY_CARE_PROVIDER_SITE_OTHER): Payer: Self-pay

## 2022-03-20 NOTE — Telephone Encounter (Signed)
Effexor 150 not on patient current medication list

## 2022-03-25 DIAGNOSIS — R632 Polyphagia: Secondary | ICD-10-CM | POA: Diagnosis not present

## 2022-03-25 DIAGNOSIS — R7303 Prediabetes: Secondary | ICD-10-CM | POA: Diagnosis not present

## 2022-03-25 DIAGNOSIS — E559 Vitamin D deficiency, unspecified: Secondary | ICD-10-CM | POA: Diagnosis not present

## 2022-03-25 DIAGNOSIS — E65 Localized adiposity: Secondary | ICD-10-CM | POA: Diagnosis not present

## 2022-04-01 DIAGNOSIS — H2513 Age-related nuclear cataract, bilateral: Secondary | ICD-10-CM | POA: Diagnosis not present

## 2022-04-01 DIAGNOSIS — H43393 Other vitreous opacities, bilateral: Secondary | ICD-10-CM | POA: Diagnosis not present

## 2022-04-01 DIAGNOSIS — H25043 Posterior subcapsular polar age-related cataract, bilateral: Secondary | ICD-10-CM | POA: Diagnosis not present

## 2022-04-01 DIAGNOSIS — H18413 Arcus senilis, bilateral: Secondary | ICD-10-CM | POA: Diagnosis not present

## 2022-04-01 DIAGNOSIS — H2511 Age-related nuclear cataract, right eye: Secondary | ICD-10-CM | POA: Diagnosis not present

## 2022-04-09 DIAGNOSIS — Z6841 Body Mass Index (BMI) 40.0 and over, adult: Secondary | ICD-10-CM | POA: Diagnosis not present

## 2022-04-09 DIAGNOSIS — R7303 Prediabetes: Secondary | ICD-10-CM | POA: Diagnosis not present

## 2022-04-09 DIAGNOSIS — Z9189 Other specified personal risk factors, not elsewhere classified: Secondary | ICD-10-CM | POA: Diagnosis not present

## 2022-04-09 DIAGNOSIS — F4323 Adjustment disorder with mixed anxiety and depressed mood: Secondary | ICD-10-CM | POA: Diagnosis not present

## 2022-04-09 DIAGNOSIS — I10 Essential (primary) hypertension: Secondary | ICD-10-CM | POA: Diagnosis not present

## 2022-04-16 DIAGNOSIS — F4323 Adjustment disorder with mixed anxiety and depressed mood: Secondary | ICD-10-CM | POA: Diagnosis not present

## 2022-04-20 ENCOUNTER — Other Ambulatory Visit: Payer: Self-pay | Admitting: *Deleted

## 2022-04-20 MED ORDER — VENLAFAXINE HCL ER 75 MG PO CP24: 75.0000 mg | ORAL_CAPSULE | Freq: Every day | ORAL | 0 refills | Status: DC

## 2022-04-29 DIAGNOSIS — E559 Vitamin D deficiency, unspecified: Secondary | ICD-10-CM | POA: Diagnosis not present

## 2022-04-29 DIAGNOSIS — R7303 Prediabetes: Secondary | ICD-10-CM | POA: Diagnosis not present

## 2022-04-29 DIAGNOSIS — F4323 Adjustment disorder with mixed anxiety and depressed mood: Secondary | ICD-10-CM | POA: Diagnosis not present

## 2022-04-29 DIAGNOSIS — F329 Major depressive disorder, single episode, unspecified: Secondary | ICD-10-CM | POA: Diagnosis not present

## 2022-05-06 ENCOUNTER — Encounter: Payer: Self-pay | Admitting: *Deleted

## 2022-05-06 DIAGNOSIS — F4323 Adjustment disorder with mixed anxiety and depressed mood: Secondary | ICD-10-CM | POA: Diagnosis not present

## 2022-05-13 DIAGNOSIS — F4323 Adjustment disorder with mixed anxiety and depressed mood: Secondary | ICD-10-CM | POA: Diagnosis not present

## 2022-05-14 DIAGNOSIS — H269 Unspecified cataract: Secondary | ICD-10-CM | POA: Diagnosis not present

## 2022-05-14 DIAGNOSIS — H2512 Age-related nuclear cataract, left eye: Secondary | ICD-10-CM | POA: Diagnosis not present

## 2022-05-14 DIAGNOSIS — H2511 Age-related nuclear cataract, right eye: Secondary | ICD-10-CM | POA: Diagnosis not present

## 2022-05-20 DIAGNOSIS — F4323 Adjustment disorder with mixed anxiety and depressed mood: Secondary | ICD-10-CM | POA: Diagnosis not present

## 2022-05-20 DIAGNOSIS — I10 Essential (primary) hypertension: Secondary | ICD-10-CM | POA: Diagnosis not present

## 2022-05-20 DIAGNOSIS — F329 Major depressive disorder, single episode, unspecified: Secondary | ICD-10-CM | POA: Diagnosis not present

## 2022-05-21 DIAGNOSIS — H2512 Age-related nuclear cataract, left eye: Secondary | ICD-10-CM | POA: Diagnosis not present

## 2022-05-21 DIAGNOSIS — H269 Unspecified cataract: Secondary | ICD-10-CM | POA: Diagnosis not present

## 2022-06-10 DIAGNOSIS — F329 Major depressive disorder, single episode, unspecified: Secondary | ICD-10-CM | POA: Diagnosis not present

## 2022-06-10 DIAGNOSIS — E65 Localized adiposity: Secondary | ICD-10-CM | POA: Diagnosis not present

## 2022-07-09 DIAGNOSIS — L710 Perioral dermatitis: Secondary | ICD-10-CM | POA: Diagnosis not present

## 2022-07-09 DIAGNOSIS — Z7189 Other specified counseling: Secondary | ICD-10-CM | POA: Diagnosis not present

## 2022-07-19 ENCOUNTER — Encounter: Payer: Self-pay | Admitting: Family Medicine

## 2022-07-19 ENCOUNTER — Ambulatory Visit: Payer: BC Managed Care – PPO | Admitting: Family Medicine

## 2022-07-19 VITALS — BP 130/75 | HR 55 | Temp 97.7°F | Ht 63.0 in | Wt 235.0 lb

## 2022-07-19 DIAGNOSIS — H2512 Age-related nuclear cataract, left eye: Secondary | ICD-10-CM | POA: Diagnosis not present

## 2022-07-19 DIAGNOSIS — R350 Frequency of micturition: Secondary | ICD-10-CM | POA: Diagnosis not present

## 2022-07-19 DIAGNOSIS — R632 Polyphagia: Secondary | ICD-10-CM

## 2022-07-19 DIAGNOSIS — F339 Major depressive disorder, recurrent, unspecified: Secondary | ICD-10-CM | POA: Diagnosis not present

## 2022-07-19 DIAGNOSIS — N3281 Overactive bladder: Secondary | ICD-10-CM

## 2022-07-19 LAB — URINALYSIS, ROUTINE W REFLEX MICROSCOPIC
Bilirubin Urine: NEGATIVE
Hgb urine dipstick: NEGATIVE
Ketones, ur: NEGATIVE
Leukocytes,Ua: NEGATIVE
Nitrite: NEGATIVE
Specific Gravity, Urine: >=1.03 — AB (ref 1.000–1.030)
Total Protein, Urine: NEGATIVE
Urine Glucose: NEGATIVE
Urobilinogen, UA: 0.2 (ref 0.0–1.0)
pH: 5.5 (ref 5.0–8.0)

## 2022-07-19 MED ORDER — PHENTERMINE HCL 37.5 MG PO TABS: 18.7500 mg | ORAL_TABLET | Freq: Every day | ORAL | 0 refills | Status: AC

## 2022-07-19 MED ORDER — PREDNISONE 20 MG PO TABS: 20.0000 mg | ORAL_TABLET | Freq: Every day | ORAL | 0 refills | Status: AC

## 2022-07-19 MED ORDER — AZELASTINE HCL 0.1 % NA SOLN: 2.0000 | Freq: Two times a day (BID) | NASAL | 12 refills | Status: AC

## 2022-07-19 MED ORDER — AMOXICILLIN 875 MG PO TABS: 875.0000 mg | ORAL_TABLET | Freq: Two times a day (BID) | ORAL | 0 refills | Status: AC

## 2022-07-19 NOTE — Assessment & Plan Note (Signed)
BMI today is 41.  She has previously been following with weight management for this.  She has been prescribed phentermine in the past and has done well with this.  No significant side effects.  Medications do help with her cravings and binge eating tendencies.  Her database was reviewed without red flags.  Vies patient we could refill today however that should not be an ongoing prescription.  She voiced understanding.

## 2022-07-19 NOTE — Progress Notes (Signed)
   Brooke Clarke is a 61 y.o. female who presents today for an office visit.  Assessment/Plan:  New/Acute Problems: Eustachian tube dysfunction Likely secondary to viral URI though may have some underlying allergic tinnitus as well.  Will start Astelin nasal spray.  Also send in pocket prescription for amoxicillin and prednisone with instruction not start Symptoms are not improving over the next few days or if she feels like she is developing an ear infection.  We discussed reasons return to care.  Follow-up as needed.  Chronic Problems Addressed Today: Depression, recurrent ([place]) Recently moved to [place].  This has been stressful but overall seems to be managing well.  She is on Effexor 75 mg daily.  She feels like this is an effective dose.  No reported SI or HI today.  She is looking to establish care with a new PCP in her new town however we will follow-up with [place] in the interim until she establishes with a new care provider.  Morbid obesity (HCC) BMI today is 41.  She has previously been following with weight management for this.  She has been prescribed phentermine in the past and has done well with this.  No significant side effects.  Medications do help with her cravings and binge eating tendencies.  Her database was reviewed without red flags.  Vies patient we could refill today however that should not be an ongoing prescription.  She voiced understanding.  OAB (overactive bladder) She has had more issues with urinary frequency for the past several months to years.  She is interested in seeing a specialist for this.  Will check UA and urine culture today.  Will treat as needed.  If negative we will have her follow-up with urogynecology for further evaluation and management.     Subjective:  HPI:  See A/P for status chronic conditions.  Patient is here with right ear pain and right throat pain.  Symptoms started about a week ago.  She tried using Advil with modest improvement.   Also tried using warm although well with some improvement.  Symptoms seem to be worse at night.  She has had some drainage and congestion as well as some.  Some postnasal drip.  No fevers or chills.  Has been normal.  No specific treatments tried.        Objective:  Physical Exam: BP 130/75   Pulse (!) 55   Temp 97.7 F (36.5 C) (Temporal)   Ht '5\' 3"'$  (1.6 m)   Wt 235 lb (106.6 kg)   SpO2 97%   BMI 41.63 kg/m   Gen: No acute distress, resting comfortably HEENT: Left TM clear.  Right TM with effusion. CV: Regular rate and rhythm with no murmurs appreciated Pulm: Normal work of breathing, clear to auscultation bilaterally with no crackles, wheezes, or rhonchi Neuro: Grossly normal, moves all extremities Psych: Normal affect and thought content      Deejay Koppelman M. Jerline Pain, MD 07/19/2022 12:20 PM

## 2022-07-19 NOTE — Assessment & Plan Note (Signed)
She has had more issues with urinary frequency for the past several months to years.  She is interested in seeing a specialist for this.  Will check UA and urine culture today.  Will treat as needed.  If negative we will have her follow-up with urogynecology for further evaluation and management.

## 2022-07-19 NOTE — Patient Instructions (Signed)
It was very nice to see you today!  You have fluid and pressure noted behind you.  Please start the nasal spray.  Start antibiotic prednisone if not improving.  We will send a small refill on your phentermine.  Please let us know if you have any issues with this.  We will check a urine sample today.  Will refer you to a urogynecologist for further management.  Take care, Dr Jerline Pain  PLEASE NOTE:  If you had any lab tests please let us know if you have not heard back within a few days. You may see your results on mychart before we have a chance to review them but we will give you a call once they are reviewed by [place]. If we ordered any referrals today, please let us know if you have not heard from their office within the next week.   Please try these tips to maintain a healthy lifestyle:  Eat at least 3 REAL meals and 1-2 snacks per day.  Aim for no more than 5 hours between eating.  If you eat breakfast, please do so within one hour of getting up.   Each meal should contain half fruits/vegetables, one quarter protein, and one quarter carbs (no bigger than a computer mouse)  Cut down on sweet beverages. This includes juice, soda, and sweet tea.   Drink at least 1 glass of water with each meal and aim for at least 8 glasses per day  Exercise at least 150 minutes every week.

## 2022-07-19 NOTE — Assessment & Plan Note (Signed)
Recently moved to [place].  This has been stressful but overall seems to be managing well.  She is on Effexor 75 mg daily.  She feels like this is an effective dose.  No reported SI or HI today.  She is looking to establish care with a new PCP in her new town however we will follow-up with [place] in the interim until she establishes with a new care provider.

## 2022-07-21 LAB — URINE CULTURE
MICRO NUMBER:: 14290207
SPECIMEN QUALITY:: ADEQUATE

## 2022-07-23 NOTE — Progress Notes (Signed)
Please inform patient of the following:  Urine culture is negative for infection.  Recommend referral to urogynecology for further management for her overactive bladder.

## 2022-07-24 DIAGNOSIS — L603 Nail dystrophy: Secondary | ICD-10-CM | POA: Diagnosis not present

## 2022-07-24 DIAGNOSIS — M79674 Pain in right toe(s): Secondary | ICD-10-CM | POA: Diagnosis not present

## 2022-07-24 DIAGNOSIS — M79675 Pain in left toe(s): Secondary | ICD-10-CM | POA: Diagnosis not present

## 2022-07-24 DIAGNOSIS — L600 Ingrowing nail: Secondary | ICD-10-CM | POA: Diagnosis not present

## 2022-08-04 DIAGNOSIS — L710 Perioral dermatitis: Secondary | ICD-10-CM | POA: Diagnosis not present

## 2022-08-07 DIAGNOSIS — M9901 Segmental and somatic dysfunction of cervical region: Secondary | ICD-10-CM | POA: Diagnosis not present

## 2023-06-26 ENCOUNTER — Telehealth: Payer: Self-pay | Admitting: *Deleted

## 2023-06-26 ENCOUNTER — Other Ambulatory Visit: Payer: Self-pay | Admitting: *Deleted

## 2023-06-26 NOTE — Telephone Encounter (Signed)
LVM to  patient to verified if patient agreed with Refills to be send to [facility]  Was received Rx request for Rx Venlafaxine

## 2023-06-27 ENCOUNTER — Other Ambulatory Visit: Payer: Self-pay | Admitting: *Deleted

## 2023-06-30 ENCOUNTER — Other Ambulatory Visit: Payer: Self-pay | Admitting: *Deleted

## 2023-06-30 MED ORDER — VENLAFAXINE HCL ER 75 MG PO CP24: 75.0000 mg | ORAL_CAPSULE | Freq: Every day | ORAL | 0 refills | Status: AC

## 2023-06-30 NOTE — Telephone Encounter (Signed)
Rx send to [facility], per patient request

## 2023-07-06 ENCOUNTER — Other Ambulatory Visit: Payer: Self-pay | Admitting: Family Medicine

## 2024-03-11 ENCOUNTER — Other Ambulatory Visit: Payer: Self-pay

## 2024-09-10 ENCOUNTER — Other Ambulatory Visit (HOSPITAL_COMMUNITY): Payer: Self-pay
